# Patient Record
Sex: Female | Born: 1940 | Race: White | Hispanic: No | Marital: Married | Smoking: Never smoker
Health system: Southern US, Community
[De-identification: ages and names within clinical notes are randomized; demographics above are authoritative.]

## PROBLEM LIST (undated history)

## (undated) DIAGNOSIS — M1990 Unspecified osteoarthritis, unspecified site: Secondary | ICD-10-CM

## (undated) DIAGNOSIS — E785 Hyperlipidemia, unspecified: Secondary | ICD-10-CM

## (undated) DIAGNOSIS — I10 Essential (primary) hypertension: Secondary | ICD-10-CM

## (undated) DIAGNOSIS — G500 Trigeminal neuralgia: Secondary | ICD-10-CM

## (undated) DIAGNOSIS — L900 Lichen sclerosus et atrophicus: Secondary | ICD-10-CM

## (undated) DIAGNOSIS — E079 Disorder of thyroid, unspecified: Secondary | ICD-10-CM

## (undated) DIAGNOSIS — B351 Tinea unguium: Secondary | ICD-10-CM

## (undated) HISTORY — DX: Essential (primary) hypertension: I10

## (undated) HISTORY — DX: Trigeminal neuralgia: G50.0

## (undated) HISTORY — DX: Unspecified osteoarthritis, unspecified site: M19.90

## (undated) HISTORY — DX: Hyperlipidemia, unspecified: E78.5

## (undated) HISTORY — DX: Tinea unguium: B35.1

## (undated) HISTORY — DX: Lichen sclerosus et atrophicus: L90.0

## (undated) HISTORY — DX: Disorder of thyroid, unspecified: E07.9

---

## 1983-06-27 HISTORY — PX: DILATION AND CURETTAGE OF UTERUS: SHX78

## 2004-03-29 ENCOUNTER — Ambulatory Visit: Payer: Self-pay | Admitting: Internal Medicine

## 2004-07-05 ENCOUNTER — Ambulatory Visit: Payer: Self-pay | Admitting: Unknown Physician Specialty

## 2005-10-12 ENCOUNTER — Ambulatory Visit: Payer: Self-pay | Admitting: Unknown Physician Specialty

## 2006-06-26 HISTORY — PX: COLONOSCOPY: SHX174

## 2006-11-16 ENCOUNTER — Ambulatory Visit: Payer: Self-pay | Admitting: Gastroenterology

## 2009-04-21 ENCOUNTER — Ambulatory Visit: Payer: Self-pay | Admitting: Unknown Physician Specialty

## 2010-05-17 ENCOUNTER — Ambulatory Visit: Payer: Self-pay | Admitting: Unknown Physician Specialty

## 2011-06-12 ENCOUNTER — Ambulatory Visit: Payer: Self-pay | Admitting: Unknown Physician Specialty

## 2011-10-03 ENCOUNTER — Ambulatory Visit: Payer: Self-pay | Admitting: Internal Medicine

## 2011-10-18 ENCOUNTER — Ambulatory Visit: Payer: Self-pay | Admitting: Surgery

## 2011-10-18 DIAGNOSIS — I10 Essential (primary) hypertension: Secondary | ICD-10-CM

## 2011-10-27 ENCOUNTER — Ambulatory Visit: Payer: Self-pay | Admitting: Surgery

## 2012-02-28 ENCOUNTER — Ambulatory Visit: Payer: Self-pay | Admitting: General Practice

## 2014-10-18 NOTE — Op Note (Signed)
PATIENT NAME:  Pamela Garner, Pamela Garner MR#:  [id number] DATE OF BIRTH:  24-Jan-1941  DATE OF PROCEDURE:  10/27/2011  PREOPERATIVE DIAGNOSIS: Chronic cholecystitis.   POSTOPERATIVE DIAGNOSIS: Acute and chronic cholecystitis with cholelithiasis.   PROCEDURE: Laparoscopic cholecystectomy.   SURGEON: Rodena Goldmann III, MD  ANESTHESIA: General.   DESCRIPTION OF PROCEDURE: With the patient in supine position after induction of appropriate general anesthesia, the patient's abdomen was prepped with Betadine, draped in sterile towels. Patient placed head down, feet up position. Small infraumbilical incision was made in standard fashion and carried down bluntly through subcutaneous tissue. Veress needle was used to cannulate the peritoneal cavity. CO2 was insufflated to appropriate pressure measurements. When approximately 2.5 liters of CO2 were instilled, the Veress needle was withdrawn. An 11 mm [employer] port inserted in the peritoneal cavity. Intraperitoneal position was confirmed and CO2 was reinsufflated. The patient was placed in head up, feet down position, rotated slightly to left side. Subxiphoid transverse incision was made and an 11 mm port inserted under direct vision. Two lateral ports 5 mm in size were inserted under direct vision. The gallbladder was barely visible under the omentum. It appeared to be chronically inflamed, indurated and edematous. About 25 mL of dark bilateral were aspirated without difficulty. The gallbladder was then grasped superiorly and laterally exposing the hepatoduodenal ligament. Cystic artery and cystic duct were identified. The cystic duct clip was traced back to its origin in the gallbladder itself. It lay parallel to what I believe is the common bile duct. Cystic duct was clipped on the gallbladder side and opened. There are multiple stones in the cystic duct. I could not thread a cholangiogram catheter into the cystic duct because of its short length and was very  concerned about the possibility of compromising the common bile duct. After evaluating the anatomy once again the cystic duct was doubly clipped on the common duct side and divided. Cystic artery was visualized, doubly clipped and divided. The gallbladder was then dissected free from its bed and delivered using hook and cautery apparatus. Once the gallbladder was free the camera remained in the umbilical port and the gallbladder was captured in an Endo Catch apparatus and brought out through the subxiphoid port. The area was copiously suctioned and irrigated. The camera was then moved to the upper port to review the midline incision. Did not appear to be any evidence of any bowel or vascular injury. The camera was returned to the umbilical port and the subxiphoid port removed. Figure-of-eight suture of 0 Vicryl was used to close the incision using a needle passer to affect closure. The abdomen was then desufflated. All ports withdrawn without difficulty. Skin incisions were closed with 5-0 nylon. The area was infiltrated with 0.25% Marcaine for postoperative pain control. Sterile dressings were applied. The patient returned to the recovery room having tolerated procedure well. Sponge, instrument, and needle counts were correct x2 in the Operating Room.   ____________________________ Rodena Goldmann III, MD rle:cms D: 10/27/2011 10:07:39 ET T: 10/27/2011 10:44:23 ET JOB#: [id number]  cc: Micheline Maze, MD, <Dictator> Adin Hector, MD Rodena Goldmann MD ELECTRONICALLY SIGNED 11/03/2011 7:25

## 2017-05-07 ENCOUNTER — Other Ambulatory Visit: Payer: Self-pay | Admitting: Internal Medicine

## 2017-05-07 DIAGNOSIS — Z1231 Encounter for screening mammogram for malignant neoplasm of breast: Secondary | ICD-10-CM

## 2017-05-30 ENCOUNTER — Ambulatory Visit
Admission: RE | Admit: 2017-05-30 | Discharge: 2017-05-30 | Disposition: A | Discharge status: Home / Self Care | Payer: Medicare Other | Source: Ambulatory Visit | Attending: Internal Medicine | Admitting: Internal Medicine

## 2017-05-30 ENCOUNTER — Encounter: Payer: Self-pay | Admitting: Radiology

## 2017-05-30 DIAGNOSIS — Z1231 Encounter for screening mammogram for malignant neoplasm of breast: Secondary | ICD-10-CM | POA: Diagnosis present

## 2017-06-07 ENCOUNTER — Other Ambulatory Visit: Payer: Self-pay | Admitting: *Deleted

## 2017-06-07 ENCOUNTER — Inpatient Hospital Stay
Admission: RE | Admit: 2017-06-07 | Discharge: 2017-06-07 | Disposition: A | Discharge status: Home / Self Care | Payer: Self-pay | Source: Ambulatory Visit | Attending: *Deleted | Admitting: *Deleted

## 2017-06-07 DIAGNOSIS — Z9289 Personal history of other medical treatment: Secondary | ICD-10-CM

## 2017-06-13 ENCOUNTER — Ambulatory Visit (INDEPENDENT_AMBULATORY_CARE_PROVIDER_SITE_OTHER): Payer: Medicare Other | Admitting: Certified Nurse Midwife

## 2017-06-13 ENCOUNTER — Encounter: Payer: Self-pay | Admitting: Certified Nurse Midwife

## 2017-06-13 VITALS — BP 140/78 | HR 78 | Ht 65.0 in | Wt 182.0 lb

## 2017-06-13 DIAGNOSIS — M1990 Unspecified osteoarthritis, unspecified site: Secondary | ICD-10-CM | POA: Insufficient documentation

## 2017-06-13 DIAGNOSIS — N904 Leukoplakia of vulva: Secondary | ICD-10-CM | POA: Diagnosis not present

## 2017-06-13 DIAGNOSIS — E079 Disorder of thyroid, unspecified: Secondary | ICD-10-CM | POA: Insufficient documentation

## 2017-06-13 DIAGNOSIS — E785 Hyperlipidemia, unspecified: Secondary | ICD-10-CM | POA: Insufficient documentation

## 2017-06-13 DIAGNOSIS — I10 Essential (primary) hypertension: Secondary | ICD-10-CM | POA: Insufficient documentation

## 2017-06-13 MED ORDER — CLOBETASOL PROPIONATE 0.05 % EX OINT: TOPICAL_OINTMENT | CUTANEOUS | 1 refills | Status: DC

## 2017-06-13 NOTE — Progress Notes (Signed)
  History of Present Illness:  Pamela Garner is a 76 y.o. who was started on clobetasol 0.05% in treatment of lichen sclerosus  approximately 12 months ago. Since that time, she states that her symptoms have resolved. She never came back for a follow up, and so has continued to use the ointment 3x/week, even though her symptoms have resolved. She states she is out of ointment now and needs a refill.  Her exam a year ago was significant for whitening of the anterior labia, the perineum and the perianal area.  PMHx: She  has a past medical history of Degenerative joint disease, Hyperlipidemia, Hypertension, Lichen sclerosus, and Thyroid disease. Also,  has a past surgical history that includes Colonoscopy (2008) and Dilation and curettage of uterus (1985)., family history includes Cancer in her maternal grandmother; Diabetes in her brother; Hypertension in her brother; Lung cancer (age of onset: 57) in her father; Raynaud syndrome in her sister.,  reports that  has never smoked. she has never used smokeless tobacco. She reports that she does not drink alcohol or use drugs.  She has a current medication list which includes the following prescription(s): finacea, lisinopril, naproxen sodium, synthroid, and clobetasol ointment. Also, is allergic to chlorpheniramine-phenylephrine.  ROS  Physical Exam:  BP 140/78   Pulse 78   Ht 5\' 5"  (1.651 m)   Wt 182 lb (82.6 kg)   BMI 30.29 kg/m  Body mass index is 30.29 kg/m. Constitutional: Well nourished, well developed female in no acute distress.   Neuro: Grossly intact Psych:  Normal mood and affect.   Vulva: No whitening of the labia. Slight whitening remaining on the perineum.    Assessment: Medication treatment is going very well for her lichen sclerosus.  Plan: Recommend using the clobetasol 2x/week on the perineum x 2 weeks then stop using the ointment unless the symptoms resume, then can restart at 3x/week to the affected area. RX sent to  pharmacy  She was amenable to this plan and we will see her back PRN.  Dalia Heading, CNM [facility], [facility] 06/13/2017  3:23 PM

## 2018-04-29 ENCOUNTER — Emergency Department: Payer: Medicare Other

## 2018-04-29 ENCOUNTER — Other Ambulatory Visit: Payer: Self-pay

## 2018-04-29 ENCOUNTER — Emergency Department
Admission: EM | Admit: 2018-04-29 | Discharge: 2018-04-29 | Disposition: A | Discharge status: Home / Self Care | Payer: Medicare Other | Attending: Emergency Medicine | Admitting: Emergency Medicine

## 2018-04-29 ENCOUNTER — Encounter: Payer: Self-pay | Admitting: Emergency Medicine

## 2018-04-29 DIAGNOSIS — Y929 Unspecified place or not applicable: Secondary | ICD-10-CM | POA: Insufficient documentation

## 2018-04-29 DIAGNOSIS — S60459A Superficial foreign body of unspecified finger, initial encounter: Secondary | ICD-10-CM

## 2018-04-29 DIAGNOSIS — Y999 Unspecified external cause status: Secondary | ICD-10-CM | POA: Insufficient documentation

## 2018-04-29 DIAGNOSIS — Z79899 Other long term (current) drug therapy: Secondary | ICD-10-CM | POA: Diagnosis not present

## 2018-04-29 DIAGNOSIS — Y939 Activity, unspecified: Secondary | ICD-10-CM | POA: Diagnosis not present

## 2018-04-29 DIAGNOSIS — L089 Local infection of the skin and subcutaneous tissue, unspecified: Secondary | ICD-10-CM

## 2018-04-29 DIAGNOSIS — W273XXA Contact with needle (sewing), initial encounter: Secondary | ICD-10-CM | POA: Insufficient documentation

## 2018-04-29 DIAGNOSIS — S60451A Superficial foreign body of left index finger, initial encounter: Secondary | ICD-10-CM | POA: Insufficient documentation

## 2018-04-29 DIAGNOSIS — I10 Essential (primary) hypertension: Secondary | ICD-10-CM | POA: Diagnosis not present

## 2018-04-29 MED ORDER — SULFAMETHOXAZOLE-TRIMETHOPRIM 800-160 MG PO TABS: 1.0000 | ORAL_TABLET | Freq: Two times a day (BID) | ORAL | 0 refills | Status: DC

## 2018-04-29 MED ORDER — SULFAMETHOXAZOLE-TRIMETHOPRIM 800-160 MG PO TABS
1.0000 | ORAL_TABLET | Freq: Once | ORAL | Status: AC
  Administered 2018-04-29: 1 via ORAL
  Filled 2018-04-29: qty 1

## 2018-04-29 MED ORDER — LIDOCAINE HCL (PF) 1 % IJ SOLN
5.0000 mL | Freq: Once | INTRAMUSCULAR | Status: AC
  Administered 2018-04-29: 5 mL
  Filled 2018-04-29: qty 5

## 2018-04-29 NOTE — ED Provider Notes (Signed)
[facility] Emergency Department Provider Note ____________________________________________  Time seen: 2058  I have reviewed the triage vital signs and the nursing notes.  HISTORY  Chief Complaint  Finger Injury  HPI Pamela Garner is a 77 y.o. female who presents to the ED accompanied by her husband, for evaluation of retained foreign body to the left index finger.  Patient describes she accidentally impaled herself with a sewing needle, and notes it broke off within her index finger. She presents with redness and swelling to the fingertip. She reports a current tetanus status.   Past Medical History:  Diagnosis Date  . Degenerative joint disease   . Hyperlipidemia   . Hypertension   . Lichen sclerosus   . Thyroid disease     Patient Active Problem List   Diagnosis Date Noted  . Lichen sclerosus et atrophicus of the vulva 06/13/2017  . Thyroid disease   . Hypertension   . Hyperlipidemia   . Degenerative joint disease     Past Surgical History:  Procedure Laterality Date  . COLONOSCOPY  2008  . DILATION AND CURETTAGE OF UTERUS  1985   endometrial polyps    Prior to Admission medications   Medication Sig Start Date End Date Taking? Authorizing Provider  clobetasol ointment (TEMOVATE) 0.05 % Apply to affected area at hs twice a week x2 weeks then stop. Can resume using 3 times a week if symptoms return 06/13/17   Dalia Heading, CNM  FINACEA 15 % cream APPLY ON THE SKIN DAILY 05/11/17   [provider]  lisinopril (PRINIVIL,ZESTRIL) 20 MG tablet  05/09/17   [provider]  naproxen sodium (ALEVE) 220 MG tablet Take by mouth.    [provider]  sulfamethoxazole-trimethoprim (BACTRIM DS,SEPTRA DS) 800-160 MG tablet Take 1 tablet by mouth 2 (two) times daily. 04/29/18   Nathanial Arrighi, Dannielle Karvonen, PA-C  SYNTHROID 112 MCG tablet  05/09/17   [provider]    Allergies Chlorpheniramine-phenylephrine  Family  History  Problem Relation Age of Onset  . Lung cancer Father 31  . Raynaud syndrome Sister   . Diabetes Brother   . Hypertension Brother   . Cancer Maternal Grandmother        unknown primary    Social History Social History   Tobacco Use  . Smoking status: Never Smoker  . Smokeless tobacco: Never Used  Substance Use Topics  . Alcohol use: No    Frequency: Never  . Drug use: No    Review of Systems  Constitutional: Negative for fever. Cardiovascular: Negative for chest pain. Respiratory: Negative for shortness of breath. Musculoskeletal: Negative for back pain. Left index finger retained FB Skin: Negative for rash. Neurological: Negative for headaches, focal weakness or numbness. ____________________________________________  PHYSICAL EXAM:  VITAL SIGNS: ED Triage Vitals  Enc Vitals Group     BP 04/29/18 2022 (!) 149/63     Pulse Rate 04/29/18 2022 79     Resp 04/29/18 2022 18     Temp 04/29/18 2022 97.6 F (36.4 C)     Temp Source 04/29/18 2022 Oral     SpO2 04/29/18 2022 97 %     Weight 04/29/18 2022 184 lb (83.5 kg)     Height 04/29/18 2022 5\' 4"  (1.626 m)     Head Circumference --      Peak Flow --      Pain Score 04/29/18 2025 2     Pain Loc --      Pain Edu? --  Excl. in [place]? --     Constitutional: Alert and oriented. Well appearing and in no distress. Head: Normocephalic and atraumatic. Eyes: Conjunctivae are normal. Normal extraocular movements Cardiovascular: Normal rate, regular rhythm. Normal distal pulses. Respiratory: Normal respiratory effort. No wheezes/rales/rhonchi. Musculoskeletal: Left index finger with local edema and streaking in the distal phalanx.  There is a defect to the dorsal nail consistent with a puncture wound.  Nontender with normal range of motion in all extremities.  Neurologic:  Normal gait without ataxia. Normal speech and language. No gross focal neurologic deficits are appreciated. Skin:  Skin is warm, dry and intact.  No rash noted. ____________________________________________   RADIOLOGY  Left Index Finger  IMPRESSION: 1. 8 mm metallic foreign body within the distal second digit that either abuts or penetrates the cortex of the distal phalanx tuft. ____________________________________________  PROCEDURES  Bactrim DS 1 PO  .Foreign Body Removal Date/Time: 04/29/2018 10:36 PM Performed by: Melvenia Needles, PA-C Authorized by: Melvenia Needles, PA-C  Consent: Verbal consent obtained. Written consent not obtained. Consent given by: patient Patient understanding: patient states understanding of the procedure being performed Patient consent: the patient's understanding of the procedure matches consent given Site marked: the operative site was marked Imaging studies: imaging studies available Patient identity confirmed: verbally with patient Body area: skin General location: upper extremity Location details: left index finger Anesthesia method: transthecal block.  Anesthesia: Local Anesthetic: lidocaine 1% without epinephrine Anesthetic total: 4 mL  Sedation: Patient sedated: no  Patient restrained: no Patient cooperative: yes Localization method: visualized Removal mechanism: forceps Dressing: antibiotic ointment Tendon involvement: none Depth: subcutaneous Complexity: complex 1 objects recovered. Objects recovered: sewing needle Post-procedure assessment: foreign body removed Patient tolerance: Patient tolerated the procedure well with no immediate complications  The lateral portion of the finger nail was removed exposing the FB within the nailbed.  ____________________________________________  INITIAL IMPRESSION / ASSESSMENT AND PLAN / ED COURSE  Patient with ED evaluation of a retained foreign body to the left index finger.  Patient's nail was impaled with a sewing needle that was retained within the distal fat pad.  X-ray evaluation confirms retained metallic  foreign body.  Patient consents to and tolerates procedure to remove the lateral portion of the nail exposing the proximal end of the sewing needle, and it is removed without difficulty from the nailbed.  No nailbed repair is performed as a puncture wound is appreciated in the nailbed.  The wound is dressed with petroleum gauze and bandages.  Patient is given wound care instructions and a prescription for Bactrim to take as directed.  She will follow-up with primary provider or return to the ED as needed. ____________________________________________  FINAL CLINICAL IMPRESSION(S) / ED DIAGNOSES  Final diagnoses:  Contact with sewing needle, initial encounter  Foreign body in finger-infected, initial encounter      Melvenia Needles, PA-C 04/29/18 2342    Harvest Dark, MD 04/30/18 2353

## 2018-04-29 NOTE — ED Triage Notes (Signed)
Patient ambulatory to triage with steady gait, without difficulty or distress noted; pt reports sewing machine needle went straight thru left index finger; cont to have pain/redness

## 2018-04-29 NOTE — Discharge Instructions (Addendum)
We has successfully removed a broken needle from the fingertip. The nail had to removed and the fingertip infection will be treated with antibiotics. Take them as directed, and keep the wound clean and dry. Follow-up with your provider as needed.

## 2018-05-15 ENCOUNTER — Other Ambulatory Visit: Payer: Self-pay | Admitting: Internal Medicine

## 2018-05-15 DIAGNOSIS — Z1231 Encounter for screening mammogram for malignant neoplasm of breast: Secondary | ICD-10-CM

## 2019-02-17 ENCOUNTER — Other Ambulatory Visit (HOSPITAL_COMMUNITY): Payer: Self-pay | Admitting: Internal Medicine

## 2019-02-17 ENCOUNTER — Other Ambulatory Visit: Payer: Self-pay | Admitting: Internal Medicine

## 2019-02-17 DIAGNOSIS — G44031 Episodic paroxysmal hemicrania, intractable: Secondary | ICD-10-CM

## 2019-02-26 ENCOUNTER — Other Ambulatory Visit: Payer: Self-pay

## 2019-02-26 ENCOUNTER — Ambulatory Visit
Admission: RE | Admit: 2019-02-26 | Discharge: 2019-02-26 | Disposition: A | Discharge status: Home / Self Care | Payer: Medicare Other | Source: Ambulatory Visit | Attending: Internal Medicine | Admitting: Internal Medicine

## 2019-02-26 DIAGNOSIS — G44031 Episodic paroxysmal hemicrania, intractable: Secondary | ICD-10-CM | POA: Insufficient documentation

## 2019-02-26 LAB — POCT I-STAT CREATININE: Creatinine, Ser: 0.8 mg/dL (ref 0.44–1.00)

## 2019-02-26 MED ORDER — GADOBUTROL 1 MMOL/ML IV SOLN
8.0000 mL | Freq: Once | INTRAVENOUS | Status: AC | PRN
  Administered 2019-02-26: 11:00:00 8 mL via INTRAVENOUS

## 2019-07-17 ENCOUNTER — Ambulatory Visit: Payer: Medicare Other | Attending: Internal Medicine

## 2019-07-17 DIAGNOSIS — Z23 Encounter for immunization: Secondary | ICD-10-CM

## 2019-07-17 NOTE — Progress Notes (Signed)
   Covid-19 Vaccination Clinic  Name:  Pamela Garner    MRN: [id number] DOB: [birth year]  07/17/2019  Pamela Garner was observed post Covid-19 immunization for 15 minutes without incidence. She was provided with Vaccine Information Sheet and instruction to access the V-Safe system.   Pamela Garner was instructed to call 911 with any severe reactions post vaccine: Marland Kitchen Difficulty breathing  . Swelling of your face and throat  . A fast heartbeat  . A bad rash all over your body  . Dizziness and weakness    Immunizations Administered    Name Date Dose VIS Date Route   Pfizer COVID-19 Vaccine 07/17/2019  9:42 AM 0.3 mL 06/06/2019 Intramuscular   Manufacturer: [place]   Lot: [id number]   [place]: [id number]

## 2019-08-07 ENCOUNTER — Ambulatory Visit: Payer: Medicare Other

## 2019-08-08 ENCOUNTER — Ambulatory Visit: Payer: Medicare Other | Attending: Internal Medicine

## 2019-08-08 DIAGNOSIS — Z23 Encounter for immunization: Secondary | ICD-10-CM

## 2019-08-08 NOTE — Progress Notes (Signed)
   Covid-19 Vaccination Clinic  Name:  Jayni Trach    MRN: [id number] DOB: 08-07-1940  08/08/2019  Ms. Spradlin was observed post Covid-19 immunization for 15 minutes without incidence. She was provided with Vaccine Information Sheet and instruction to access the V-Safe system.   Ms. Motola was instructed to call 911 with any severe reactions post vaccine: Marland Kitchen Difficulty breathing  . Swelling of your face and throat  . A fast heartbeat  . A bad rash all over your body  . Dizziness and weakness    Immunizations Administered    Name Date Dose VIS Date Route   Pfizer COVID-19 Vaccine 08/08/2019  5:30 PM 0.3 mL 06/06/2019 Intramuscular   Manufacturer: [place]   Lot: [id number]   [place]: [id number]

## 2019-08-09 ENCOUNTER — Ambulatory Visit: Payer: Medicare Other

## 2019-10-14 ENCOUNTER — Other Ambulatory Visit (HOSPITAL_COMMUNITY)
Admission: RE | Admit: 2019-10-14 | Discharge: 2019-10-14 | Disposition: A | Discharge status: Home / Self Care | Payer: Medicare Other | Source: Ambulatory Visit | Attending: Certified Nurse Midwife | Admitting: Certified Nurse Midwife

## 2019-10-14 ENCOUNTER — Ambulatory Visit (INDEPENDENT_AMBULATORY_CARE_PROVIDER_SITE_OTHER): Payer: Medicare Other | Admitting: Certified Nurse Midwife

## 2019-10-14 ENCOUNTER — Other Ambulatory Visit: Payer: Self-pay

## 2019-10-14 ENCOUNTER — Encounter: Payer: Self-pay | Admitting: Certified Nurse Midwife

## 2019-10-14 VITALS — BP 138/82 | Ht 64.0 in | Wt 191.0 lb

## 2019-10-14 DIAGNOSIS — Z124 Encounter for screening for malignant neoplasm of cervix: Secondary | ICD-10-CM | POA: Insufficient documentation

## 2019-10-14 DIAGNOSIS — K219 Gastro-esophageal reflux disease without esophagitis: Secondary | ICD-10-CM | POA: Insufficient documentation

## 2019-10-14 DIAGNOSIS — Z01419 Encounter for gynecological examination (general) (routine) without abnormal findings: Secondary | ICD-10-CM | POA: Diagnosis not present

## 2019-10-14 DIAGNOSIS — N904 Leukoplakia of vulva: Secondary | ICD-10-CM | POA: Diagnosis not present

## 2019-10-14 DIAGNOSIS — Z1231 Encounter for screening mammogram for malignant neoplasm of breast: Secondary | ICD-10-CM

## 2019-10-14 DIAGNOSIS — I4510 Unspecified right bundle-branch block: Secondary | ICD-10-CM | POA: Insufficient documentation

## 2019-10-14 MED ORDER — CLOBETASOL PROPIONATE 0.05 % EX OINT: TOPICAL_OINTMENT | CUTANEOUS | 1 refills | Status: DC

## 2019-10-14 MED ORDER — CLOTRIMAZOLE 1 % EX CREA: 1.0000 "application " | TOPICAL_CREAM | Freq: Two times a day (BID) | CUTANEOUS | 0 refills | Status: DC

## 2019-10-14 NOTE — Progress Notes (Signed)
Gynecology Annual Exam  PCP: Adin Hector, MD  Chief Complaint:  Chief Complaint  Patient presents with  . Gynecologic Exam    History of Present Illness:Pamela Garner is a 79 year old  Caucasian/White female, [place], who presents for her annual exam.  Her menses are absent and she is postmenopausal and is not on hormone therapy (was on HRT from 1991-2006). 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  She has had no spotting.   The patient's past medical history is remarkable for hypertension, DJD, hypothyroidism, hyperlipidemia, lichen sclerosus et atrophicus, trigeminal neuralgia, and dyspepsia.  Her PCP is Dr Ramonita Lab.  Since her last annual GYN exam dated [id number], her lichen sclerosus flares for time to time and se applies Temovate ointment to her vulva. Is currently symptomatic. Needs a refill. She also complains of irritation under her pannus and under her breasts. Has been applying powder to help keep those areas dry. She is also being treated for nail fungus on her feet. She is sexually active. She does not have vaginal dryness.  Her most recent pap smear was obtained 10/28/2013 and was with negative cells and negative HPV DNA.  Her most recent mammogram obtained on 06/07/2017 was negative.   There is no family history of breast cancer. (There is one note in 1985 that states patient's MGM and 2 maternal aunts had breast cancer-but she denies that now-and Dr Caryl Comes states in a 2011 note that there is no hx of breast cancer in the family)  There is no family history of ovarian cancer.  The patient does do occasional monthly self breast exams.  She had a colonoscopy in 11/16/2006 that was normal . She has aged out of colonoscopies She had a recent DEXA scan obtained in 05/21/2019 that was normal The patient does not smoke.  The patient does not drink alcohol. The patient does not use illegal drugs.  The patient does not exercise, but  she has an active lifestyle.  The patient does get adequate calcium in her diet.  She had a recent cholesterol screen by her PCP in 04/2019 that was borderline elevated    Review of Systems: Review of Systems  Constitutional: Negative for chills, fever and weight loss.  HENT: Negative for congestion, sinus pain and sore throat.   Eyes: Negative for blurred vision and pain.  Respiratory: Negative for hemoptysis, shortness of breath and wheezing.   Cardiovascular: Negative for chest pain, palpitations and leg swelling.  Gastrointestinal: Positive for constipation. Negative for abdominal pain, blood in stool, diarrhea, heartburn, nausea and vomiting.  Genitourinary: Negative for dysuria, frequency, hematuria and urgency.  Musculoskeletal: Positive for joint pain. Negative for back pain and myalgias.  Skin: Positive for rash (under breasts and under pannus). Negative for itching.       Positive for nail fungus on her toes.  Neurological: Negative for dizziness, tingling and headaches.  Endo/Heme/Allergies: Negative for environmental allergies and polydipsia. Bruises/bleeds easily.       Negative for hirsutism   Psychiatric/Behavioral: Negative for depression. The patient is not nervous/anxious and does not have insomnia.     Past Medical History:  Past Medical History:  Diagnosis Date  . Degenerative joint disease   . Dermatophytosis, nail    feet  . Hyperlipidemia   . Hypertension   . Lichen sclerosus   . Thyroid disease   . Trigeminal neuralgia of left side of face  Past Surgical History:  Past Surgical History:  Procedure Laterality Date  . COLONOSCOPY  2008  . DILATION AND CURETTAGE OF UTERUS  1985   endometrial polyps    Family History:  Family History  Problem Relation Age of Onset  . Lung cancer Father 47  . Raynaud syndrome Sister   . Diabetes Brother   . Hypertension Brother   . Cancer Maternal Grandmother        unknown primary    Social History:   Social History   Socioeconomic History  . Marital status: Married    Spouse name: Not on file  . Number of children: 2  . Years of education: Not on file  . Highest education level: Not on file  Occupational History  . Not on file  Tobacco Use  . Smoking status: Never Smoker  . Smokeless tobacco: Never Used  Substance and Sexual Activity  . Alcohol use: No  . Drug use: No  . Sexual activity: Yes    Partners: Male    Birth control/protection: Post-menopausal  Other Topics Concern  . Not on file  Social History Narrative  . Not on file   Social Determinants of Health   Financial Resource Strain:   . Difficulty of Paying Living Expenses:   Food Insecurity:   . Worried About Charity fundraiser in the Last Year:   . Arboriculturist in the Last Year:   Transportation Needs:   . Film/video editor (Medical):   Marland Kitchen Lack of Transportation (Non-Medical):   Physical Activity:   . Days of Exercise per Week:   . Minutes of Exercise per Session:   Stress:   . Feeling of Stress :   Social Connections:   . Frequency of Communication with Friends and Family:   . Frequency of Social Gatherings with Friends and Family:   . Attends Religious Services:   . Active Member of Clubs or Organizations:   . Attends Archivist Meetings:   Marland Kitchen Marital Status:   Intimate Partner Violence:   . Fear of Current or Ex-Partner:   . Emotionally Abused:   Marland Kitchen Physically Abused:   . Sexually Abused:     Allergies:  Allergies  Allergen Reactions  . Chlorpheniramine-Phenylephrine Rash    Medications: Current Outpatient Medications on File Prior to Visit  Medication Sig Dispense Refill  . Cyanocobalamin (B-12 PO) Take by mouth daily.    Marland Kitchen FINACEA 15 % cream APPLY ON THE SKIN DAILY  0  . lisinopril (PRINIVIL,ZESTRIL) 20 MG tablet     . naproxen sodium (ALEVE) 220 MG tablet Take by mouth.    . OXcarbazepine (TRILEPTAL) 150 MG tablet Take by mouth.    . SYNTHROID 112 MCG tablet     .  terbinafine (LAMISIL) 250 MG tablet Take 250 mg by mouth daily.     No current facility-administered medications on file prior to visit.   Physical Exam Vitals: BP 138/82   Ht 5\' 4"  (1.626 m)   Wt 86.6 kg   BMI 32.79 kg/m   General: WF in  NAD HEENT: normocephalic, anicteric Neck: no thyroid enlargement, no palpable nodules, no cervical lymphadenopathy  Pulmonary: No increased work of breathing, CTAB Cardiovascular: RRR, without murmur  Breast: Breast symmetrical, no tenderness, no palpable nodules or masses, no skin or nipple retraction present, no nipple discharge.  No axillary, infraclavicular or supraclavicular lymphadenopathy. THere is powder caked on the skin under both breasts Abdomen: Soft, non-tender, non-distended.  Umbilicus  without lesions.  No hepatomegaly or masses palpable. No evidence of hernia. Under pannus on the right there is a 7cm x 2 cm strip of inflammation.  Genitourinary:  External: Whitening of labial sulcus bilaterally with fissures, whitening of the perineum.  Normal urethral meatus, normal  Bartholin's and Skene's glands.    Vagina: Normal vaginal mucosa, no evidence of prolapse.    Cervix: Grossly normal in appearance, no bleeding, non-tender  Uterus: Anteverted, normal size, shape, and consistency, mobile, and non-tender  Adnexa: No adnexal masses, non-tender  Rectal: deferred  Lymphatic: no evidence of inguinal lymphadenopathy Extremities: trace ankle edema, superficial varicose veins bilateral ankles. Neurologic: Grossly intact Psychiatric: mood appropriate, affect full  KOH of scraping from under pannus: + for hyphae   Assessment: 79 y.o. [id number]  postmenopausal gyn exam Lichen sclerosus flare  Apply Temovate ointment qhs x 2 weeks the decrease to 3x/week and return in 6 weeks for follow uo Fungal skin infection  Lotrimin ointment to under breasts and under pannus 1-2x/day until infection clears  Plan:   1) Breast cancer screening -  recommend monthly self breast exams. Patient would like to continue mammograms. Mammogram was ordered today.  2) Colon cancer screening: Has aged out according to her PCP note.  3) Cervical cancer screening - Pap was done. ASCCP guidelines and rational discussed.  Patient opts for every 3 years screening interval  4) Routine healthcare maintenance including cholesterol and diabetes screening managed by PCP   5) DEXA screen-UTD and normal.  6) RTO in 6 weeks for a follow up on lichen sclerosus.  Dalia Heading, CNM

## 2019-10-15 LAB — CYTOLOGY - PAP
Adequacy: ABSENT
Diagnosis: NEGATIVE

## 2019-10-16 ENCOUNTER — Encounter: Payer: Self-pay | Admitting: Certified Nurse Midwife

## 2019-10-22 ENCOUNTER — Encounter: Payer: Self-pay | Admitting: Certified Nurse Midwife

## 2019-10-31 ENCOUNTER — Ambulatory Visit
Admission: RE | Admit: 2019-10-31 | Discharge: 2019-10-31 | Disposition: A | Discharge status: Home / Self Care | Payer: Medicare Other | Source: Ambulatory Visit | Attending: Certified Nurse Midwife | Admitting: Certified Nurse Midwife

## 2019-10-31 DIAGNOSIS — Z1231 Encounter for screening mammogram for malignant neoplasm of breast: Secondary | ICD-10-CM | POA: Insufficient documentation

## 2019-11-24 NOTE — Progress Notes (Addendum)
  History of Present Illness:  Pamela Garner is a 79 y.o. who was started back on clobetasol ointment for a flare of her lichen sclerosus approximately 6 weeks ago. Since that time, she states that her symptoms are improving. She does not have any current itching. She does report getting irritated by her underwear bunching up on the left labia majora/ inner thigh. She has been applying Lotrimin to her fungal infection under her pannus daily. Has no itching or irritation there.  PMHx: She  has a past medical history of Degenerative joint disease, Dermatophytosis, nail, Hyperlipidemia, Hypertension, Lichen sclerosus, Thyroid disease, and Trigeminal neuralgia of left side of face. Also,  has a past surgical history that includes Colonoscopy (2008) and Dilation and curettage of uterus (1985)., family history includes Alzheimer's disease in her brother; Cancer in her maternal grandmother; Cancer (age of onset: 59) in her son; Diabetes in her brother; Hypertension in her brother; Kidney cancer in her brother; Lung cancer (age of onset: 86) in her father; Raynaud syndrome in her sister.,  reports that she has never smoked. She has never used smokeless tobacco. She reports that she does not drink alcohol or use drugs.  She has a current medication list which includes the following prescription(s): clobetasol ointment, clotrimazole, cyanocobalamin, finacea, lisinopril, naproxen sodium, oxcarbazepine, synthroid, and terbinafine. Also, is allergic to chlorpheniramine-phenylephrine.  ROS  Physical Exam:  Vital signs: BP 125/76   Ht 5\' 1"  (1.549 m)   Wt 190 lb 6.4 oz (86.4 kg)   BMI 35.98 kg/m Constitutional: Well nourished, well developed female in no acute distress.  General: WF in NAD Abdomen: rash under pannus is gone. Vulva: erythematous rash, well demarcated on left groin area between left inner thigh and left labia.  Mild whitening of clitoral hood and perineum. There is a linear brown macular area on  inner right labia Neuro: Grossly intact Psych:  Normal mood and affect.    Assessment:  Resolution of fungal infection on abdomen Improvement of lichen sclerosus Vulvitis left labia/ inner thighs Plan: Continue clobetasol on perineal and clitoral area 3x/week Lotrisone BID for rash on left labia She was amenable to this plan and we will see her back in 6 weeks  Dalia Heading, [facility], [facility] 11/24/2019  7:11 PM

## 2019-11-25 ENCOUNTER — Ambulatory Visit (INDEPENDENT_AMBULATORY_CARE_PROVIDER_SITE_OTHER): Payer: Medicare Other | Admitting: Certified Nurse Midwife

## 2019-11-25 ENCOUNTER — Other Ambulatory Visit: Payer: Self-pay

## 2019-11-25 ENCOUNTER — Encounter: Payer: Self-pay | Admitting: Certified Nurse Midwife

## 2019-11-25 VITALS — BP 125/76 | Ht 61.0 in | Wt 190.4 lb

## 2019-11-25 DIAGNOSIS — N904 Leukoplakia of vulva: Secondary | ICD-10-CM | POA: Diagnosis not present

## 2019-11-25 DIAGNOSIS — B369 Superficial mycosis, unspecified: Secondary | ICD-10-CM | POA: Diagnosis not present

## 2019-11-25 DIAGNOSIS — N762 Acute vulvitis: Secondary | ICD-10-CM

## 2019-11-25 MED ORDER — CLOTRIMAZOLE-BETAMETHASONE 1-0.05 % EX CREA: 1.0000 "application " | TOPICAL_CREAM | Freq: Two times a day (BID) | CUTANEOUS | 0 refills | Status: DC

## 2019-11-26 IMAGING — CR DG FINGER INDEX 2+V*L*
1 series · 3 of 3 positions shown · non-contrast
Comparison: 02/28/2012

CLINICAL DATA: Needle injury pain redness and swelling to the tip
of the index finger

EXAM:
LEFT INDEX FINGER 2+V

[Series 1: dg finger index left · 0.14mm/px · 3 of 3 slices shown]
[im 1/3]
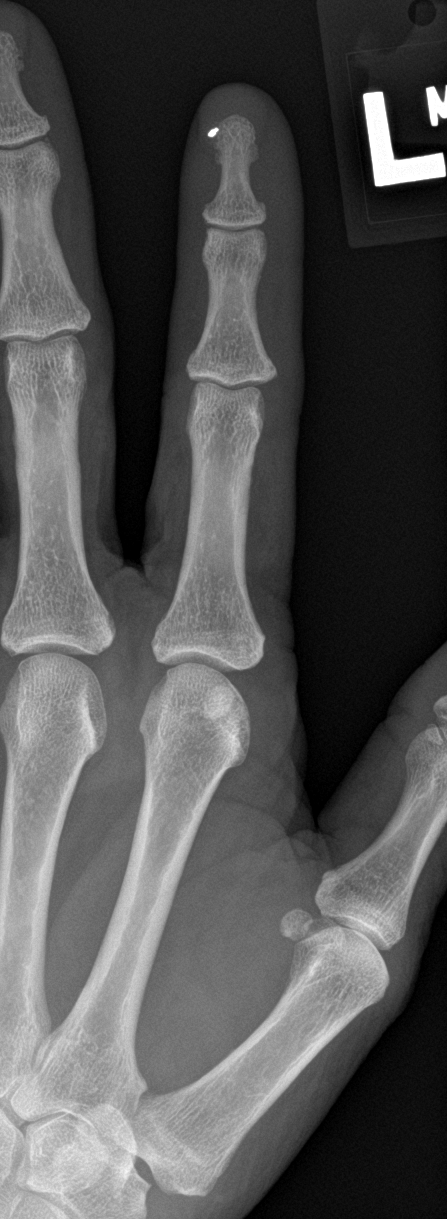
[im 2/3]
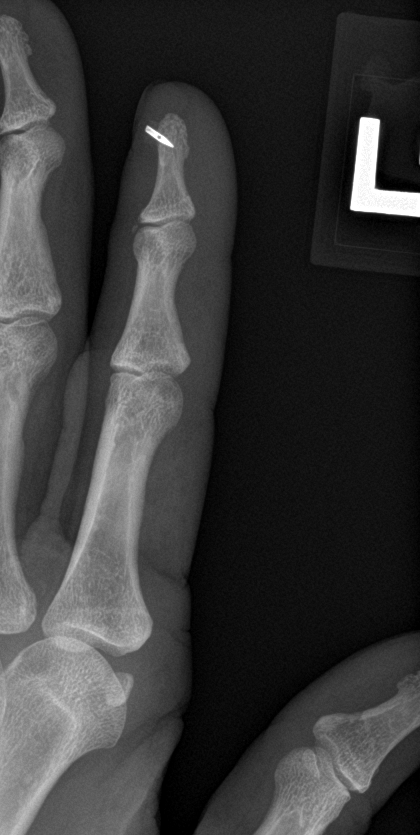
[im 3/3]
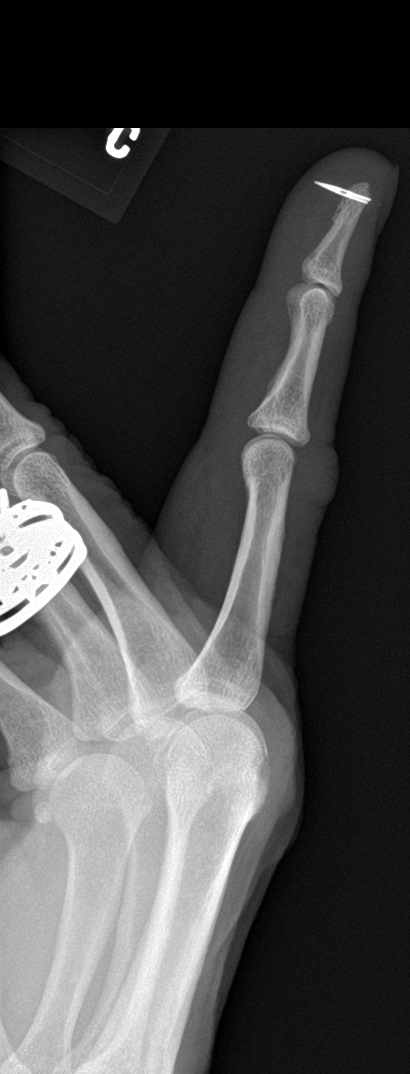

[3 of 3 positions shown; findings below may reference images not displayed]

FINDINGS: No fracture is seen. 8 mm metallic foreign body within the distal
index finger, either abuts or penetrates the cortex of the distal
phalanx on AP view.
IMPRESSION: 1. 8 mm metallic foreign body within the distal second digit that
either abuts or penetrates the cortex of the distal phalanx tuft.

## 2020-01-06 ENCOUNTER — Encounter: Payer: Self-pay | Admitting: Certified Nurse Midwife

## 2020-01-06 ENCOUNTER — Ambulatory Visit (INDEPENDENT_AMBULATORY_CARE_PROVIDER_SITE_OTHER): Payer: Medicare Other | Admitting: Certified Nurse Midwife

## 2020-01-06 ENCOUNTER — Other Ambulatory Visit: Payer: Self-pay

## 2020-01-06 VITALS — BP 134/76 | HR 60 | Ht 64.0 in | Wt 191.0 lb

## 2020-01-06 DIAGNOSIS — N762 Acute vulvitis: Secondary | ICD-10-CM | POA: Diagnosis not present

## 2020-01-06 DIAGNOSIS — N904 Leukoplakia of vulva: Secondary | ICD-10-CM | POA: Diagnosis not present

## 2020-01-06 NOTE — Progress Notes (Signed)
  History of Present Illness:  Pamela Garner is a 79 y.o. who was started back on clobetasol ointment for a flare of her lichen sclerosus approximately 12 weeks ago. Since that time, she reports that  her symptoms have continued to improve.  She does not have any current itching. When she presented 6 weeks ago for a follow up appointment, she was also diagnosed with a fungal infection on the left labia majora/ inner thigh and was prescribed Lotrisone, which she has been applying.  She had already been applying Lotrimin to her fungal infection under her pannus daily. Has no itching or irritation under her pannus.  PMHx: She  has a past medical history of Degenerative joint disease, Dermatophytosis, nail, Hyperlipidemia, Hypertension, Lichen sclerosus, Thyroid disease, and Trigeminal neuralgia of left side of face. Also,  has a past surgical history that includes Colonoscopy (2008) and Dilation and curettage of uterus (1985)., family history includes Alzheimer's disease in her brother; Cancer in her maternal grandmother; Cancer (age of onset: 6) in her son; Diabetes in her brother; Hypertension in her brother; Kidney cancer in her brother; Lung cancer (age of onset: 34) in her father; Raynaud syndrome in her sister.,  reports that she has never smoked. She has never used smokeless tobacco. She reports that she does not drink alcohol and does not use drugs.  She has a current medication list which includes the following prescription(s): clobetasol ointment, clotrimazole, clotrimazole-betamethasone, cyanocobalamin, finacea, lisinopril, naproxen sodium, oxcarbazepine, synthroid, and terbinafine. Also, is allergic to chlorpheniramine-phenylephrine.  ROS  Physical Exam:  Vital signs: BP 134/76   Pulse 60   Ht 5\' 4"  (1.626 m)   Wt 191 lb (86.6 kg)   BMI 32.79 kg/m   Constitutional: Well nourished, well developed female in no acute distress.  General: WF in NAD Abdomen: rash under pannus is gone. Vulva:  Her rash on her left groin area between left inner thigh and left labia has resolved Mild whitening of clitoral hood and perineum. Small fissure on perineum.There is a linear brown macular area on inner right labia Neuro: Grossly intact Psych:  Normal mood and affect.    Assessment:  Resolution of fungal infection on abdomen Improvement of lichen sclerosus Vulvitis left labia/ inner thighs resolved Plan: Continue clobetasol on perineal area qhs x 2 weeks then stop RTO for annual and prn  Dalia Heading, [facility], [facility] 01/06/2020

## 2020-04-12 ENCOUNTER — Ambulatory Visit: Payer: Medicare Other | Attending: Internal Medicine

## 2020-04-12 DIAGNOSIS — Z23 Encounter for immunization: Secondary | ICD-10-CM

## 2020-04-12 NOTE — Progress Notes (Signed)
   Covid-19 Vaccination Clinic  Name:  Pamela Garner    MRN: [id number] DOB: 24-Jan-1941  04/12/2020  Pamela Garner was observed post Covid-19 immunization for 15 minutes without incident. She was provided with Vaccine Information Sheet and instruction to access the V-Safe system.   Pamela Garner was instructed to call 911 with any severe reactions post vaccine: Marland Kitchen Difficulty breathing  . Swelling of face and throat  . A fast heartbeat  . A bad rash all over body  . Dizziness and weakness

## 2020-05-04 ENCOUNTER — Other Ambulatory Visit: Payer: Self-pay | Admitting: Obstetrics and Gynecology

## 2020-05-04 ENCOUNTER — Telehealth: Payer: Self-pay

## 2020-05-04 MED ORDER — CLOTRIMAZOLE-BETAMETHASONE 1-0.05 % EX CREA: 1.0000 "application " | TOPICAL_CREAM | Freq: Two times a day (BID) | CUTANEOUS | 0 refills | Status: DC

## 2020-05-04 NOTE — Telephone Encounter (Signed)
Rx lotrisone crm RF eRxd.

## 2020-05-04 NOTE — Telephone Encounter (Signed)
Pt calling; needs ref of ointment [facility] gave her last - clotrimazole metanidazole dipiro......Marland Kitchen  Doesn't need it all the time just c flare up esp with tight elastic.  [facility] [place].  [phone]

## 2020-05-04 NOTE — Telephone Encounter (Signed)
Generic msg left c female - 'this is her dr's office.  Would you tell her that her rx has been sent in?"

## 2020-05-04 NOTE — Progress Notes (Signed)
Rx RF lotrisone crm prn fungal infection.

## 2020-05-26 ENCOUNTER — Other Ambulatory Visit: Payer: Self-pay | Admitting: Internal Medicine

## 2020-05-26 ENCOUNTER — Other Ambulatory Visit (HOSPITAL_COMMUNITY): Payer: Self-pay | Admitting: Internal Medicine

## 2020-05-26 DIAGNOSIS — R1011 Right upper quadrant pain: Secondary | ICD-10-CM

## 2020-06-14 ENCOUNTER — Ambulatory Visit
Admission: RE | Admit: 2020-06-14 | Discharge: 2020-06-14 | Disposition: A | Discharge status: Home / Self Care | Payer: Medicare Other | Source: Ambulatory Visit | Attending: Internal Medicine | Admitting: Internal Medicine

## 2020-06-14 ENCOUNTER — Other Ambulatory Visit: Payer: Self-pay

## 2020-06-14 DIAGNOSIS — R1011 Right upper quadrant pain: Secondary | ICD-10-CM | POA: Insufficient documentation

## 2020-06-14 MED ORDER — IOHEXOL 300 MG/ML  SOLN
100.0000 mL | Freq: Once | INTRAMUSCULAR | Status: AC | PRN
  Administered 2020-06-14: 09:00:00 100 mL via INTRAVENOUS

## 2020-12-09 ENCOUNTER — Other Ambulatory Visit: Payer: Self-pay | Admitting: Internal Medicine

## 2020-12-09 DIAGNOSIS — Z1231 Encounter for screening mammogram for malignant neoplasm of breast: Secondary | ICD-10-CM

## 2020-12-14 ENCOUNTER — Ambulatory Visit
Admission: RE | Admit: 2020-12-14 | Discharge: 2020-12-14 | Disposition: A | Discharge status: Home / Self Care | Payer: Medicare Other | Source: Ambulatory Visit | Attending: Internal Medicine | Admitting: Internal Medicine

## 2020-12-14 ENCOUNTER — Other Ambulatory Visit: Payer: Self-pay

## 2020-12-14 DIAGNOSIS — Z1231 Encounter for screening mammogram for malignant neoplasm of breast: Secondary | ICD-10-CM | POA: Insufficient documentation

## 2021-09-22 ENCOUNTER — Ambulatory Visit (INDEPENDENT_AMBULATORY_CARE_PROVIDER_SITE_OTHER): Payer: Medicare Other | Admitting: Dermatology

## 2021-09-22 DIAGNOSIS — L578 Other skin changes due to chronic exposure to nonionizing radiation: Secondary | ICD-10-CM

## 2021-09-22 DIAGNOSIS — L719 Rosacea, unspecified: Secondary | ICD-10-CM | POA: Diagnosis not present

## 2021-09-22 DIAGNOSIS — Z1283 Encounter for screening for malignant neoplasm of skin: Secondary | ICD-10-CM

## 2021-09-22 DIAGNOSIS — L814 Other melanin hyperpigmentation: Secondary | ICD-10-CM

## 2021-09-22 DIAGNOSIS — D229 Melanocytic nevi, unspecified: Secondary | ICD-10-CM

## 2021-09-22 DIAGNOSIS — D1800 Hemangioma unspecified site: Secondary | ICD-10-CM

## 2021-09-22 DIAGNOSIS — L821 Other seborrheic keratosis: Secondary | ICD-10-CM

## 2021-09-22 DIAGNOSIS — I8393 Asymptomatic varicose veins of bilateral lower extremities: Secondary | ICD-10-CM

## 2021-09-22 NOTE — Progress Notes (Signed)
? ?New Patient Visit ? ?Subjective  ?Pamela Garner is a 81 y.o. female who presents for the following: New Patient (Initial Visit) (Patient here today for tbse and rosacea. Patient reports she has been using finacea cream to help with rosacea. She denies family or personal history of skin cancer. ). ?The patient presents for Total-Body Skin Exam (TBSE) for skin cancer screening and mole check.  The patient has spots, moles and lesions to be evaluated, some may be new or changing and the patient has concerns that these could be cancer. ? ?The following portions of the chart were reviewed this encounter and updated as appropriate:  ? Tobacco  Allergies  Meds  Problems  Med Hx  Surg Hx  Fam Hx   ?  ?Review of Systems:  No other skin or systemic complaints except as noted in HPI or Assessment and Plan. ? ?Objective  ?Well appearing patient in no apparent distress; mood and affect are within normal limits. ? ?A full examination was performed including scalp, head, eyes, ears, nose, lips, neck, chest, axillae, abdomen, back, buttocks, bilateral upper extremities, bilateral lower extremities, hands, feet, fingers, toes, fingernails, and toenails. All findings within normal limits unless otherwise noted below. ? ?face ?Some pinkness at cheeks and nose  ? ? ?Assessment & Plan  ?Rosacea ?face ?Rosacea is a chronic progressive skin condition usually affecting the face of adults, causing redness and/or acne bumps. It is treatable but not curable. It sometimes affects the eyes (ocular rosacea) as well. It may respond to topical and/or systemic medication and can flare with stress, sun exposure, alcohol, exercise and some foods.  Daily application of broad spectrum spf 30+ sunscreen to face is recommended to reduce flares. ? ?Recommend starting  ?Will prescribe [facility] metronidazole/ivermectin/azelaic acid twice daily as needed to affected areas on the face. The patient was advised this is not covered by  insurance since it is made by a compounding pharmacy. They will receive an email to check out and the medication will be mailed to their home.  ? ?Instructions for [facility] Medications ? ?One or more of your medications was sent to the [facility] mail order compounding pharmacy. You will receive an email from them and can purchase the medicine through that link. It will then be mailed to your home at the address you confirmed. If for any reason you do not receive an email from them, please check your spam folder. If you still do not find the email, please let us know. [facility] phone number is [phone]. ? ?Skin cancer screening ? ?Lentigines ?- Scattered tan macules ?- Due to sun exposure ?- Benign-appearing, observe ?- Recommend daily broad spectrum sunscreen SPF 30+ to sun-exposed areas, reapply every 2 hours as needed. ?- Call for any changes ? ?Seborrheic Keratoses ?- Stuck-on, waxy, tan-brown papules and/or plaques  ?- Benign-appearing ?- Discussed benign etiology and prognosis. ?- Observe ?- Call for any changes ? ?Melanocytic Nevi ?- Tan-brown and/or pink-flesh-colored symmetric macules and papules ?- Benign appearing on exam today ?- Observation ?- Call clinic for new or changing moles ?- Recommend daily use of broad spectrum spf 30+ sunscreen to sun-exposed areas.  ? ?Varicose Veins/Spider Veins ?- Dilated blue, purple or red veins at the lower extremities ?- Reassured ?- Smaller vessels can be treated by sclerotherapy (a procedure to inject a medicine into the veins to make them disappear) if desired, but the treatment is not covered by insurance. Larger vessels may be covered if symptomatic and  we would refer to vascular surgeon if treatment desired. ? ?Hemangiomas ?- Red papules ?- Discussed benign nature ?- Observe ?- Call for any changes ? ?Actinic Damage ?- Chronic condition, secondary to cumulative UV/sun exposure ?- diffuse scaly erythematous macules with underlying  dyspigmentation ?- Recommend daily broad spectrum sunscreen SPF 30+ to sun-exposed areas, reapply every 2 hours as needed.  ?- Staying in the shade or wearing long sleeves, sun glasses (UVA+UVB protection) and wide brim hats (4-inch brim around the entire circumference of the hat) are also recommended for sun protection.  ?- Call for new or changing lesions. ? ?Skin cancer screening performed today. ? ?Return for 1 year rosacea and tbse follow up. ? ?I, Ruthell Rummage, CMA, am acting as scribe for Sarina Ser, MD. ?Documentation: I have reviewed the above documentation for accuracy and completeness, and I agree with the above. ? ?Sarina Ser, MD ? ? ?

## 2021-09-22 NOTE — Patient Instructions (Addendum)
Start [facility] metronidazole/ivermectin/azelaic acid twice daily as needed to affected areas on the face. The patient was advised this is not covered by insurance since it is made by a compounding pharmacy. They will receive an email to check out and the medication will be mailed to their home.  ? ?Instructions for [facility] Medications ? ?One or more of your medications was sent to the [facility] mail order compounding pharmacy. You will receive an email from them and can purchase the medicine through that link. It will then be mailed to your home at the address you confirmed. If for any reason you do not receive an email from them, please check your spam folder. If you still do not find the email, please let us know. [facility] phone number is [phone].  ? ? ? ? ?Melanoma ABCDEs ? ?Melanoma is the most dangerous type of skin cancer, and is the leading cause of death from skin disease.  You are more likely to develop melanoma if you: ?Have light-colored skin, light-colored eyes, or red or blond hair ?Spend a lot of time in the sun ?Tan regularly, either outdoors or in a tanning bed ?Have had blistering sunburns, especially during childhood ?Have a close family member who has had a melanoma ?Have atypical moles or large birthmarks ? ?Early detection of melanoma is key since treatment is typically straightforward and cure rates are extremely high if we catch it early.  ? ?The first sign of melanoma is often a change in a mole or a new dark spot.  The ABCDE system is a way of remembering the signs of melanoma. ? ?A for asymmetry:  The two halves do not match. ?B for border:  The edges of the growth are irregular. ?C for color:  A mixture of colors are present instead of an even brown color. ?D for diameter:  Melanomas are usually (but not always) greater than 83m - the size of a pencil eraser. ?E for evolution:  The spot keeps changing in size, shape, and color. ? ?Please check your skin  once per month between visits. You can use a small mirror in front and a large mirror behind you to keep an eye on the back side or your body.  ? ?If you see any new or changing lesions before your next follow-up, please call to schedule a visit. ? ?Please continue daily skin protection including broad spectrum sunscreen SPF 30+ to sun-exposed areas, reapplying every 2 hours as needed when you're outdoors.  ? ?Staying in the shade or wearing long sleeves, sun glasses (UVA+UVB protection) and wide brim hats (4-inch brim around the entire circumference of the hat) are also recommended for sun protection.   ? ? ?If You Need Anything After Your Visit ? ?If you have any questions or concerns for your doctor, please call our main line at [phone] press option 4 to reach your doctor's medical assistant. If no one answers, please leave a voicemail as directed and we will return your call as soon as possible. Messages left after 4 pm will be answered the following business day.  ? ?You may also send [place] message via MyChart. We typically respond to MyChart messages within 1-2 business days. ? ?For prescription refills, please ask your pharmacy to contact our office. Our fax number is [phone] ? ?If you have an urgent issue when the clinic is closed that cannot wait until the next business day, you can page your doctor at the number  below.   ? ?Please note that while we do our best to be available for urgent issues outside of office hours, we are not available 24/7.  ? ?If you have an urgent issue and are unable to reach [place], you may choose to seek medical care at your doctor's office, retail clinic, urgent care center, or emergency room. ? ?If you have a medical emergency, please immediately call 911 or go to the emergency department. ? ?Pager Numbers ? ?- Dr. Nehemiah Massed: [phone] ? ?- Dr. Laurence Ferrari: [phone] ? ?- Dr. Nicole Kindred: [phone] ? ?In the event of inclement weather, please call our main line at  [phone] for an update on the status of any delays or closures. ? ?Dermatology Medication Tips: ?Please keep the boxes that topical medications come in in order to help keep track of the instructions about where and how to use these. Pharmacies typically print the medication instructions only on the boxes and not directly on the medication tubes.  ? ?If your medication is too expensive, please contact our office at [phone] option 4 or send [place] a message through [place].  ? ?We are unable to tell what your co-pay for medications will be in advance as this is different depending on your insurance coverage. However, we may be able to find a substitute medication at lower cost or fill out paperwork to get insurance to cover a needed medication.  ? ?If a prior authorization is required to get your medication covered by your insurance company, please allow [place] 1-2 business days to complete this process. ? ?Drug prices often vary depending on where the prescription is filled and some pharmacies may offer cheaper prices. ? ?The website www.goodrx.com contains coupons for medications through different pharmacies. The prices here do not account for what the cost may be with help from insurance (it may be cheaper with your insurance), but the website can give you the price if you did not use any insurance.  ?- You can print the associated coupon and take it with your prescription to the pharmacy.  ?- You may also stop by our office during regular business hours and pick up a GoodRx coupon card.  ?- If you need your prescription sent electronically to a different pharmacy, notify our office through [facility] or by phone at [phone] option 4. ? ? ? ? ?Si Usted Necesita Algo Despu?s de Su Visita ? ?Tambi?n puede enviarnos un mensaje a trav?s de MyChart. Por lo general respondemos a los mensajes de MyChart en el transcurso de 1 a 2 d?as h?biles. ? ?Para renovar recetas, por favor pida a su farmacia que se  ponga en contacto con nuestra oficina. Nuestro n?mero de fax es el [phone]. ? ?Si tiene un asunto urgente cuando la cl?nica est? cerrada y que no puede esperar hasta el siguiente d?a h?bil, puede llamar/localizar a su doctor(a) al n?mero que aparece a continuaci?n.  ? ?Por favor, tenga en cuenta que aunque hacemos todo lo posible para estar disponibles para asuntos urgentes fuera del horario de oficina, no estamos disponibles las 24 horas del d?a, los 7 d?as de la semana.  ? ?Si tiene un problema urgente y no puede comunicarse con nosotros, puede optar por buscar atenci?n m?dica  en el consultorio de su doctor(a), en una cl?nica privada, en un centro de atenci?n urgente o en una sala de emergencias. ? ?Si tiene Engineer, maintenance (IT) m?dica, por favor llame inmediatamente al 911 o vaya a la sala de emergencias. ? ?N?meros de b?per ? ?-  Dr. Nehemiah Massed: [phone] ? ?- Dra. Moye: [phone] ? ?- Dra. Nicole Kindred: [phone] ? ?En caso de inclemencias del tiempo, por favor llame a nuestra l?nea principal al [phone] para una actualizaci?n sobre el estado de cualquier retraso o cierre. ? ?Consejos para la medicaci?n en dermatolog?a: ?Por favor, guarde las cajas en las que vienen los medicamentos de uso t?pico para ayudarle a seguir las instrucciones sobre d?nde y c?mo usarlos. Las farmacias generalmente imprimen las instrucciones del medicamento s?lo en las cajas y no directamente en los tubos del [place].  ? ?Si su medicamento es muy caro, por favor, p?ngase en contacto con Zigmund Daniel llamando al [phone] y presione la opci?n 4 o env?enos un mensaje a trav?s de MyChart.  ? ?No podemos decirle cu?l ser? su copago por los medicamentos por adelantado ya que esto es diferente dependiendo de la cobertura de su seguro. Sin embargo, es posible que podamos encontrar un medicamento sustituto a Electrical engineer un formulario para que el seguro cubra el medicamento que se considera necesario.  ? ?Si se requiere  Ardelia Mems autorizaci?n previa para que su compa??a de seguros [place] su medicamento, por favor perm?tanos de 1 a 2 d?as h?biles para completar este proceso. ? ?Los precios de los medicamentos var?an con frecuencia depend

## 2021-09-22 NOTE — Progress Notes (Deleted)
? ?  Follow-Up Visit ?  ?Subjective  ?Pamela Garner is a 81 y.o. female who presents for the following: Follow-up (Patient here today for tbse and rosacea. Finacea cream she is using and needs a refill. ). ? ?*** ? ?The following portions of the chart were reviewed this encounter and updated as appropriate:   ?  ? ?{Review of Systems:34166::"No other skin or systemic complaints."} ? ?Objective  ?Well appearing patient in no apparent distress; mood and affect are within normal limits. ? ?{Exam:34163::"A full examination was performed including scalp, head, eyes, ears, nose, lips, neck, chest, axillae, abdomen, back, buttocks, bilateral upper extremities, bilateral lower extremities, hands, feet, fingers, toes, fingernails, and toenails. All findings within normal limits unless otherwise noted below."} ? ? ?Assessment & Plan  ?Lentigines ?- Scattered tan macules ?- Due to sun exposure ?- Benign-appearing, observe ?- Recommend daily broad spectrum sunscreen SPF 30+ to sun-exposed areas, reapply every 2 hours as needed. ?- Call for any changes ? ?Seborrheic Keratoses ?- Stuck-on, waxy, tan-brown papules and/or plaques  ?- Benign-appearing ?- Discussed benign etiology and prognosis. ?- Observe ?- Call for any changes ? ?Melanocytic Nevi ?- Tan-brown and/or pink-flesh-colored symmetric macules and papules ?- Benign appearing on exam today ?- Observation ?- Call clinic for new or changing moles ?- Recommend daily use of broad spectrum spf 30+ sunscreen to sun-exposed areas.  ? ?Hemangiomas ?- Red papules ?- Discussed benign nature ?- Observe ?- Call for any changes ? ?Actinic Damage ?- Chronic condition, secondary to cumulative UV/sun exposure ?- diffuse scaly erythematous macules with underlying dyspigmentation ?- Recommend daily broad spectrum sunscreen SPF 30+ to sun-exposed areas, reapply every 2 hours as needed.  ?- Staying in the shade or wearing long sleeves, sun glasses (UVA+UVB protection) and wide brim hats  (4-inch brim around the entire circumference of the hat) are also recommended for sun protection.  ?- Call for new or changing lesions. ? ?Skin cancer screening performed today. ?No follow-ups on file. ?I, Ruthell Rummage, CMA, am acting as scribe for Sarina Ser, MD. ? ?

## 2021-09-23 ENCOUNTER — Encounter: Payer: Self-pay | Admitting: Dermatology

## 2021-12-11 ENCOUNTER — Other Ambulatory Visit: Payer: Self-pay

## 2021-12-11 ENCOUNTER — Emergency Department: Payer: Medicare Other

## 2021-12-11 ENCOUNTER — Emergency Department
Admission: EM | Admit: 2021-12-11 | Discharge: 2021-12-11 | Discharge status: Against Medical Advice | Payer: Medicare Other | Attending: Emergency Medicine | Admitting: Emergency Medicine

## 2021-12-11 DIAGNOSIS — M542 Cervicalgia: Secondary | ICD-10-CM | POA: Insufficient documentation

## 2021-12-11 DIAGNOSIS — S0990XA Unspecified injury of head, initial encounter: Secondary | ICD-10-CM | POA: Diagnosis present

## 2021-12-11 DIAGNOSIS — S022XXA Fracture of nasal bones, initial encounter for closed fracture: Secondary | ICD-10-CM | POA: Diagnosis not present

## 2021-12-11 DIAGNOSIS — R600 Localized edema: Secondary | ICD-10-CM | POA: Insufficient documentation

## 2021-12-11 DIAGNOSIS — W010XXA Fall on same level from slipping, tripping and stumbling without subsequent striking against object, initial encounter: Secondary | ICD-10-CM | POA: Diagnosis not present

## 2021-12-11 DIAGNOSIS — M79602 Pain in left arm: Secondary | ICD-10-CM | POA: Diagnosis not present

## 2021-12-11 DIAGNOSIS — Z5321 Procedure and treatment not carried out due to patient leaving prior to being seen by health care provider: Secondary | ICD-10-CM | POA: Insufficient documentation

## 2021-12-11 LAB — BASIC METABOLIC PANEL
Anion gap: 7 (ref 5–15)
BUN: 32 mg/dL — ABNORMAL HIGH (ref 8–23)
CO2: 25 mmol/L (ref 22–32)
Calcium: 9.5 mg/dL (ref 8.9–10.3)
Chloride: 107 mmol/L (ref 98–111)
Creatinine, Ser: 0.87 mg/dL (ref 0.44–1.00)
GFR, Estimated: >60 mL/min (ref >60)
Glucose, Bld: 121 mg/dL — ABNORMAL HIGH (ref 70–99)
Potassium: 4.7 mmol/L (ref 3.5–5.1)
Sodium: 139 mmol/L (ref 135–145)

## 2021-12-11 LAB — CBC
HCT: 46.2 % — ABNORMAL HIGH (ref 36.0–46.0)
Hemoglobin: 14.6 g/dL (ref 12.0–15.0)
MCH: 30.6 pg (ref 26.0–34.0)
MCHC: 31.6 g/dL (ref 30.0–36.0)
MCV: 96.9 fL (ref 80.0–100.0)
Platelets: 200 10*3/uL (ref 150–400)
RBC: 4.77 MIL/uL (ref 3.87–5.11)
RDW: 13.2 % (ref 11.5–15.5)
WBC: 9.8 10*3/uL (ref 4.0–10.5)
nRBC: 0 % (ref 0.0–0.2)

## 2021-12-11 NOTE — ED Notes (Signed)
C-collar removed, ok'd per Dr. Leonides Schanz

## 2021-12-11 NOTE — ED Notes (Signed)
Pt requesting saline lock to be removed due to it being painful.  Explaied that if she needs something that requires an IV, it will need to be put back in.  Pt states "Ill take my chances.  Please remove it".  Catheter removed intact, site clean and dry.  Bleeding controlled.  Dressed with sterile dressing

## 2021-12-11 NOTE — ED Notes (Signed)
Pt's family has decided to leave with pt. Pt states she is going to another hospital.

## 2021-12-11 NOTE — ED Triage Notes (Signed)
Pt arrived via [facility] from home when she tripped and fell on her L side, pt also reports falling onto face, has several front teeth displaced, and c/o swelling to face, nose and mouth.  Pt c/o neck pain with palpation. Placed in [place] on arrival to ED.  Pt also c/o L arm pain,  Not currently on any blood thinners and denies any LOC.

## 2022-01-11 IMAGING — CT CT ABD-PELV W/ CM
2 of 5 series · 16 of 46 positions shown, 18 images · IV contrast (omnipaque)
Comparison: None.

CLINICAL DATA: Worsening right upper quadrant pain for several
weeks

EXAM:
CT ABDOMEN AND PELVIS WITH CONTRAST
TECHNIQUE: Multidetector CT imaging of the abdomen and pelvis was performed
using the standard protocol following bolus administration of
intravenous contrast.
CONTRAST:  100mL OMNIPAQUE IOHEXOL 300 MG/ML  SOLN

[Series 2: abd pelvis 5.00 · axial · 0.70mm/px · z∈[-1510,-1100]mm · 13 of 94 slices shown, 15 images]
[im 6/94  soft-tissue]
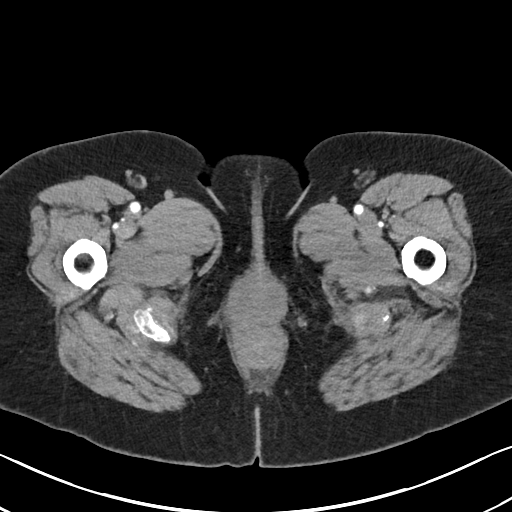
[im 6/94  bone]
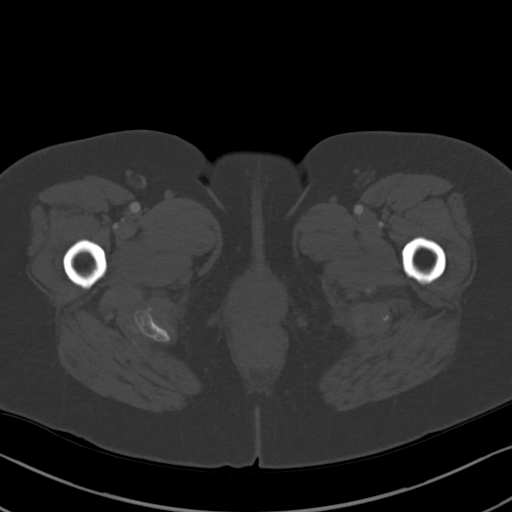
[im 11/94  soft-tissue]
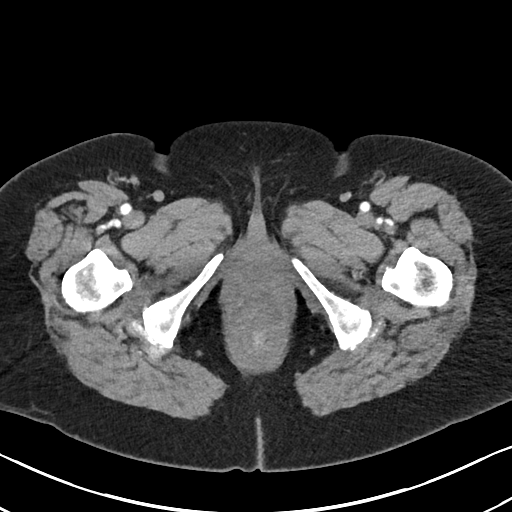
[im 21/94  soft-tissue]
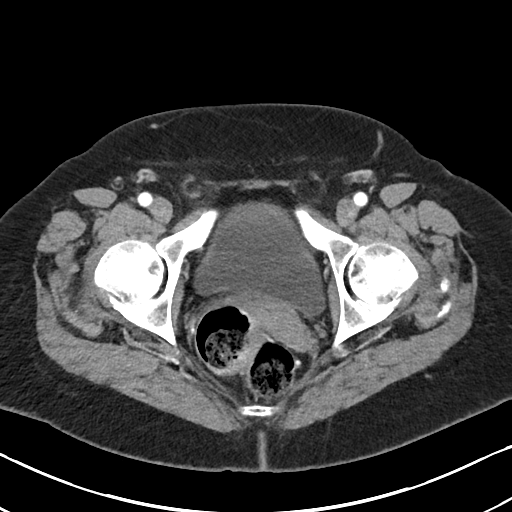
[im 26/94  soft-tissue]
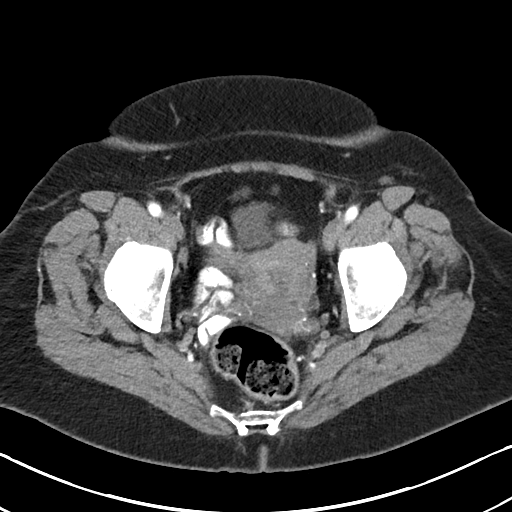
[im 32/94  soft-tissue]
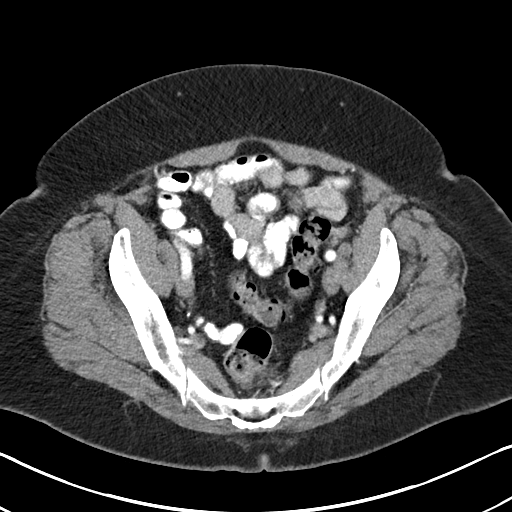
[im 42/94  soft-tissue]
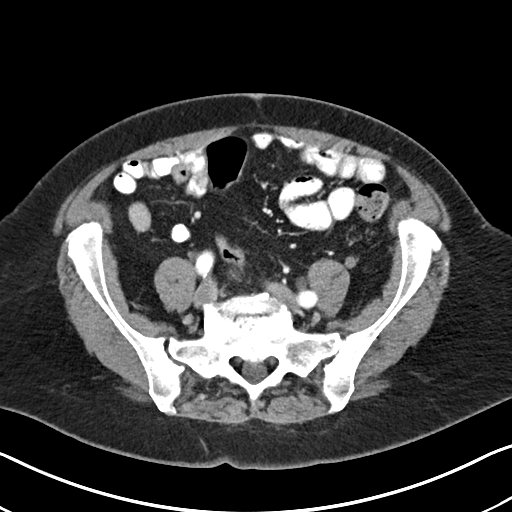
[im 47/94  soft-tissue]
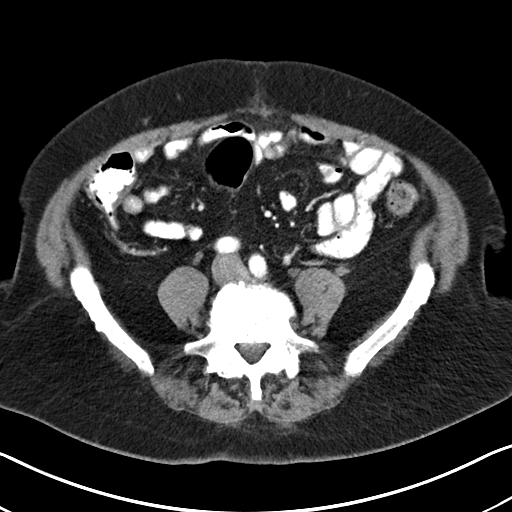
[im 52/94  soft-tissue]
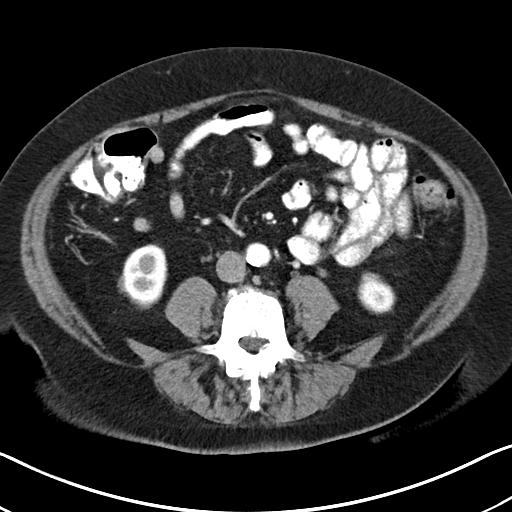
[im 63/94  soft-tissue]
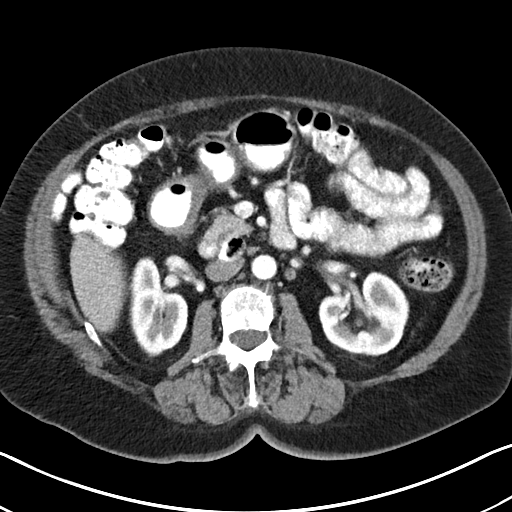
[im 63/94  bone]
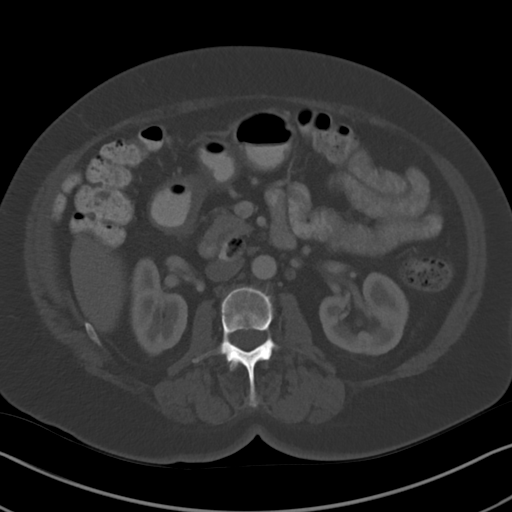
[im 68/94  soft-tissue]
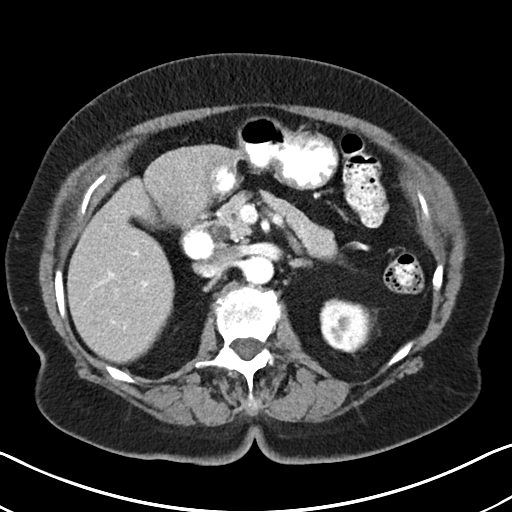
[im 73/94  soft-tissue]
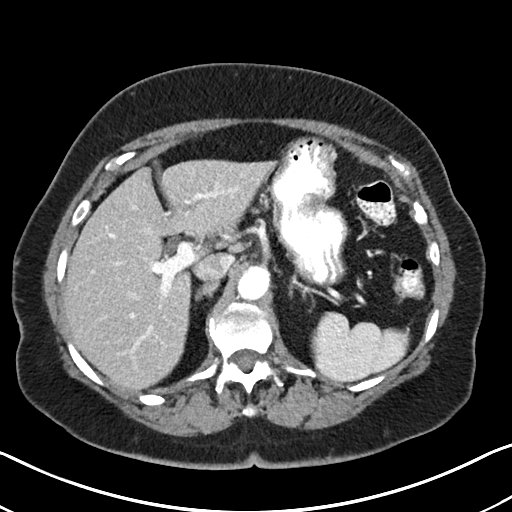
[im 83/94  soft-tissue]
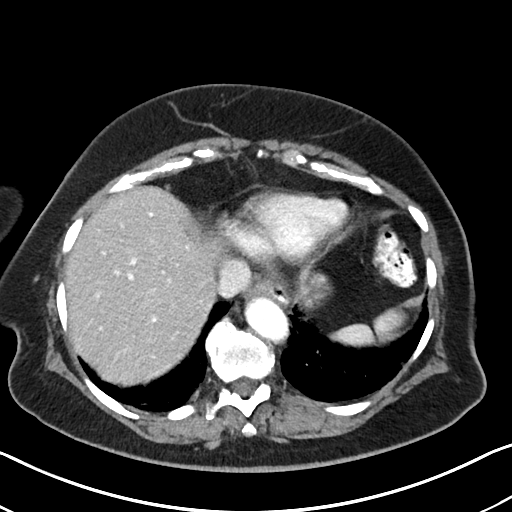
[im 88/94  soft-tissue]
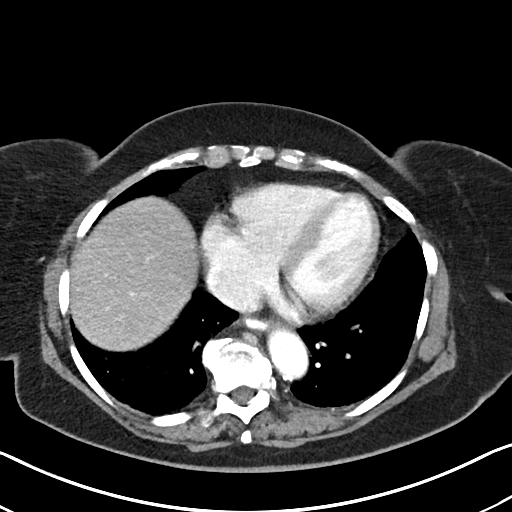

[Series 4: coronals abd pelvis 2.00 cor · coronal · 0.70mm/px · 3 of 152 slices shown]
[im 51/152  soft-tissue]
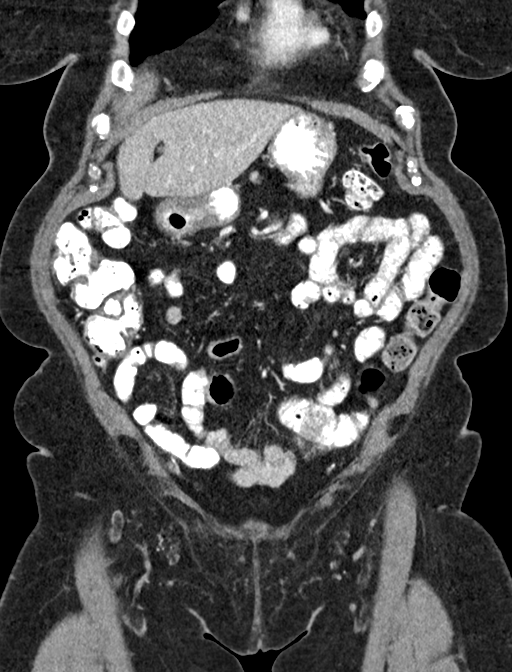
[im 68/152  soft-tissue]
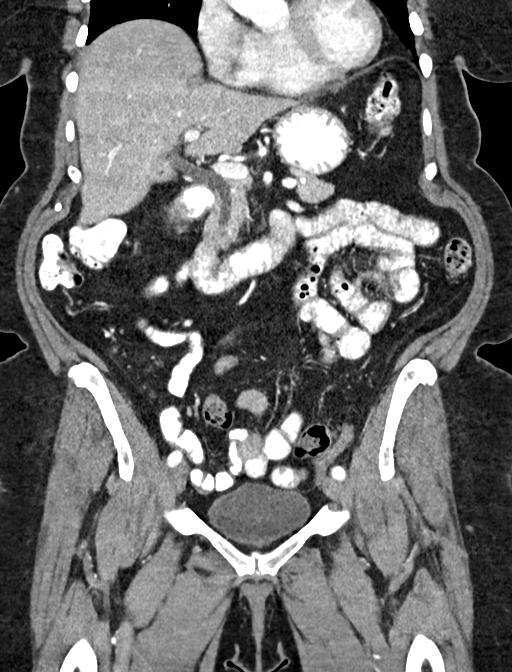
[im 84/152  soft-tissue]
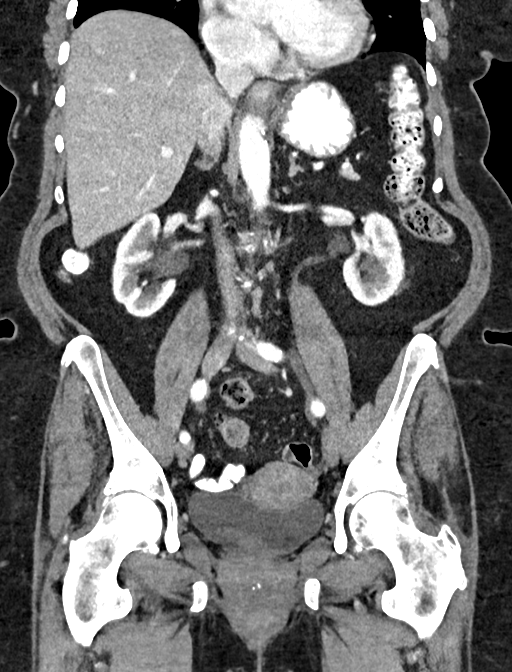

[16 of 46 positions shown; findings below may reference images not displayed]

FINDINGS: Lower chest: No acute abnormality.

Hepatobiliary: Gallbladder has been surgically removed. Diffuse
decreased attenuation is noted within the liver consistent with
fatty infiltration.

Pancreas: Unremarkable. No pancreatic ductal dilatation or
surrounding inflammatory changes.

Spleen: Normal in size without focal abnormality.

Adrenals/Urinary Tract: Adrenal glands are within normal limits.
Kidneys demonstrate a normal enhancement pattern. Normal excretion
of contrast is noted bilaterally. Small parapelvic cysts are noted
bilaterally. No renal calculi or obstructive changes are seen. The
ureters are within normal limits bilaterally. The bladder is
partially distended.

Stomach/Bowel: The appendix is well visualized within normal limits.
No obstructive or inflammatory changes of the colon are seen. Small
bowel and stomach are within normal limits.

Vascular/Lymphatic: Aortic atherosclerosis. No enlarged abdominal or
pelvic lymph nodes.

Reproductive: Uterus and bilateral adnexa are unremarkable.

Other: No abdominal wall hernia or abnormality. No abdominopelvic
ascites.

Musculoskeletal: No acute or significant osseous findings.
IMPRESSION: Fatty liver.

Postsurgical changes.

No acute abnormality noted.

## 2022-10-05 ENCOUNTER — Ambulatory Visit: Payer: Medicare Other | Admitting: Dermatology

## 2023-01-18 ENCOUNTER — Ambulatory Visit: Payer: Medicare Other | Admitting: Dermatology

## 2023-02-01 ENCOUNTER — Ambulatory Visit (INDEPENDENT_AMBULATORY_CARE_PROVIDER_SITE_OTHER): Payer: Medicare Other | Admitting: Dermatology

## 2023-02-01 DIAGNOSIS — L570 Actinic keratosis: Secondary | ICD-10-CM

## 2023-02-01 DIAGNOSIS — Z1283 Encounter for screening for malignant neoplasm of skin: Secondary | ICD-10-CM | POA: Diagnosis not present

## 2023-02-01 DIAGNOSIS — L719 Rosacea, unspecified: Secondary | ICD-10-CM | POA: Diagnosis not present

## 2023-02-01 DIAGNOSIS — I781 Nevus, non-neoplastic: Secondary | ICD-10-CM

## 2023-02-01 DIAGNOSIS — L821 Other seborrheic keratosis: Secondary | ICD-10-CM

## 2023-02-01 DIAGNOSIS — L578 Other skin changes due to chronic exposure to nonionizing radiation: Secondary | ICD-10-CM | POA: Diagnosis not present

## 2023-02-01 DIAGNOSIS — L820 Inflamed seborrheic keratosis: Secondary | ICD-10-CM | POA: Diagnosis not present

## 2023-02-01 DIAGNOSIS — L814 Other melanin hyperpigmentation: Secondary | ICD-10-CM

## 2023-02-01 DIAGNOSIS — Z79899 Other long term (current) drug therapy: Secondary | ICD-10-CM

## 2023-02-01 DIAGNOSIS — W908XXA Exposure to other nonionizing radiation, initial encounter: Secondary | ICD-10-CM

## 2023-02-01 DIAGNOSIS — D229 Melanocytic nevi, unspecified: Secondary | ICD-10-CM

## 2023-02-01 DIAGNOSIS — I8393 Asymptomatic varicose veins of bilateral lower extremities: Secondary | ICD-10-CM

## 2023-02-01 DIAGNOSIS — D1801 Hemangioma of skin and subcutaneous tissue: Secondary | ICD-10-CM

## 2023-02-01 DIAGNOSIS — Z7189 Other specified counseling: Secondary | ICD-10-CM

## 2023-02-01 NOTE — Progress Notes (Signed)
Follow-Up Visit   Subjective  Pamela Garner is a 82 y.o. female who presents for the following: Skin Cancer Screening and Full Body Skin Exam hx of rosacea , reports some spots at temples and face she would like checked.    The patient presents for Total-Body Skin Exam (TBSE) for skin cancer screening and mole check. The patient has spots, moles and lesions to be evaluated, some may be new or changing and the patient may have concern these could be cancer.    The following portions of the chart were reviewed this encounter and updated as appropriate: medications, allergies, medical history  Review of Systems:  No other skin or systemic complaints except as noted in HPI or Assessment and Plan.  Objective  Well appearing patient in no apparent distress; mood and affect are within normal limits.  A full examination was performed including scalp, head, eyes, ears, nose, lips, neck, chest, axillae, abdomen, back, buttocks, bilateral upper extremities, bilateral lower extremities, hands, feet, fingers, toes, fingernails, and toenails. All findings within normal limits unless otherwise noted below.   Relevant physical exam findings are noted in the Assessment and Plan.  left lateral eyebrow x 1 Erythematous thin papules/macules with gritty scale.   left temple x 1, right temple x 1 (2) Erythematous stuck-on, waxy papule or plaque    Assessment & Plan   SKIN CANCER SCREENING PERFORMED TODAY.  ACTINIC DAMAGE - Chronic condition, secondary to cumulative UV/sun exposure - diffuse scaly erythematous macules with underlying dyspigmentation - Recommend daily broad spectrum sunscreen SPF 30+ to sun-exposed areas, reapply every 2 hours as needed.  - Staying in the shade or wearing long sleeves, sun glasses (UVA+UVB protection) and wide brim hats (4-inch brim around the entire circumference of the hat) are also recommended for sun protection.  - Call for new or changing  lesions.  LENTIGINES, SEBORRHEIC KERATOSES, HEMANGIOMAS - Benign normal skin lesions - Benign-appearing - Call for any changes  MELANOCYTIC NEVI - Tan-brown and/or pink-flesh-colored symmetric macules and papules - Benign appearing on exam today - Observation - Call clinic for new or changing moles - Recommend daily use of broad spectrum spf 30+ sunscreen to sun-exposed areas.   Varicose Veins/Spider Veins - Dilated blue, purple or red veins at the lower extremities - Reassured - Smaller vessels can be treated by sclerotherapy (a procedure to inject a medicine into the veins to make them disappear) if desired, but the treatment is not covered by insurance. Larger vessels may be covered if symptomatic and we would refer to vascular surgeon if treatment desired.  Rosacea Face  Exam : Minimal pinkness at cheeks and nose  Chronic and persistent condition with duration or expected duration over one year. Condition is improving with treatment but not currently at goal.   Rosacea is a chronic progressive skin condition usually affecting the face of adults, causing redness and/or acne bumps. It is treatable but not curable. It sometimes affects the eyes (ocular rosacea) as well. It may respond to topical and/or systemic medication and can flare with stress, sun exposure, alcohol, exercise and some foods.  Daily application of broad spectrum spf 30+ sunscreen to face is recommended to reduce flares.   Recommend starting  Continue [facility] metronidazole/ivermectin/azelaic acid twice daily as needed to affected areas on the face. The patient was advised this is not covered by insurance since it is made by a compounding pharmacy. They will receive an email to check out and the medication will be mailed to  their home.    Instructions for [facility] Medications   One or more of your medications was sent to the [facility] mail order compounding pharmacy. You will receive an email  from them and can purchase the medicine through that link. It will then be mailed to your home at the address you confirmed. If for any reason you do not receive an email from them, please check your spam folder. If you still do not find the email, please let us know. [facility] phone number is [phone].    Actinic keratosis left lateral eyebrow x 1  Actinic keratoses are precancerous spots that appear secondary to cumulative UV radiation exposure/sun exposure over time. They are chronic with expected duration over 1 year. A portion of actinic keratoses will progress to squamous cell carcinoma of the skin. It is not possible to reliably predict which spots will progress to skin cancer and so treatment is recommended to prevent development of skin cancer.  Recommend daily broad spectrum sunscreen SPF 30+ to sun-exposed areas, reapply every 2 hours as needed.  Recommend staying in the shade or wearing long sleeves, sun glasses (UVA+UVB protection) and wide brim hats (4-inch brim around the entire circumference of the hat). Call for new or changing lesions.  Destruction of lesion - left lateral eyebrow x 1 Complexity: simple   Destruction method: cryotherapy   Informed consent: discussed and consent obtained   Timeout:  patient name, date of birth, surgical site, and procedure verified Lesion destroyed using liquid nitrogen: Yes   Region frozen until ice ball extended beyond lesion: Yes   Outcome: patient tolerated procedure well with no complications   Post-procedure details: wound care instructions given    Inflamed seborrheic keratosis (2) left temple x 1, right temple x 1  Symptomatic, irritating, patient would like treated.  Destruction of lesion - left temple x 1, right temple x 1 (2) Complexity: simple   Destruction method: cryotherapy   Informed consent: discussed and consent obtained   Timeout:  patient name, date of birth, surgical site, and procedure verified Lesion  destroyed using liquid nitrogen: Yes   Region frozen until ice ball extended beyond lesion: Yes   Outcome: patient tolerated procedure well with no complications   Post-procedure details: wound care instructions given     Return in about 1 year (around 02/01/2024) for TBSE.  IAsher Muir, CMA, am acting as scribe for Armida Sans, MD.   Documentation: I have reviewed the above documentation for accuracy and completeness, and I agree with the above.  Armida Sans, MD

## 2023-02-01 NOTE — Patient Instructions (Addendum)
Instructions for [facility] Medications  One or more of your medications was sent to the [facility] mail order compounding pharmacy. You will receive an email from them and can purchase the medicine through that link. It will then be mailed to your home at the address you confirmed. If for any reason you do not receive an email from them, please check your spam folder. If you still do not find the email, please let us know. [facility] phone number is [phone].     Melanoma ABCDEs  Melanoma is the most dangerous type of skin cancer, and is the leading cause of death from skin disease.  You are more likely to develop melanoma if you: Have light-colored skin, light-colored eyes, or red or blond hair Spend a lot of time in the sun Tan regularly, either outdoors or in a tanning bed Have had blistering sunburns, especially during childhood Have a close family member who has had a melanoma Have atypical moles or large birthmarks  Early detection of melanoma is key since treatment is typically straightforward and cure rates are extremely high if we catch it early.   The first sign of melanoma is often a change in a mole or a new dark spot.  The ABCDE system is a way of remembering the signs of melanoma.  A for asymmetry:  The two halves do not match. B for border:  The edges of the growth are irregular. C for color:  A mixture of colors are present instead of an even brown color. D for diameter:  Melanomas are usually (but not always) greater than 6mm - the size of a pencil eraser. E for evolution:  The spot keeps changing in size, shape, and color.  Please check your skin once per month between visits. You can use a small mirror in front and a large mirror behind you to keep an eye on the back side or your body.   If you see any new or changing lesions before your next follow-up, please call to schedule a visit.  Please continue daily skin protection including broad  spectrum sunscreen SPF 30+ to sun-exposed areas, reapplying every 2 hours as needed when you're outdoors.   Staying in the shade or wearing long sleeves, sun glasses (UVA+UVB protection) and wide brim hats (4-inch brim around the entire circumference of the hat) are also recommended for sun protection.    Due to recent changes in healthcare laws, you may see results of your pathology and/or laboratory studies on MyChart before the doctors have had a chance to review them. We understand that in some cases there may be results that are confusing or concerning to you. Please understand that not all results are received at the same time and often the doctors may need to interpret multiple results in order to provide you with the best plan of care or course of treatment. Therefore, we ask that you please give [place] 2 business days to thoroughly review all your results before contacting the office for clarification. Should we see a critical lab result, you will be contacted sooner.   If You Need Anything After Your Visit  If you have any questions or concerns for your doctor, please call our main line at [phone] and press option 4 to reach your doctor's medical assistant. If no one answers, please leave a voicemail as directed and we will return your call as soon as possible. Messages left after 4 pm will be answered the following business day.  You may also send [place] a message via MyChart. We typically respond to MyChart messages within 1-2 business days.  For prescription refills, please ask your pharmacy to contact our office. Our fax number is [phone].  If you have an urgent issue when the clinic is closed that cannot wait until the next business day, you can page your doctor at the number below.    Please note that while we do our best to be available for urgent issues outside of office hours, we are not available 24/7.   If you have an urgent issue and are unable to reach [place], you may choose  to seek medical care at your doctor's office, retail clinic, urgent care center, or emergency room.  If you have a medical emergency, please immediately call 911 or go to the emergency department.  Pager Numbers  - Dr. Gwen Pounds: [phone]  - Dr. Roseanne Reno: [phone]  In the event of inclement weather, please call our main line at [phone] for an update on the status of any delays or closures.  Dermatology Medication Tips: Please keep the boxes that topical medications come in in order to help keep track of the instructions about where and how to use these. Pharmacies typically print the medication instructions only on the boxes and not directly on the medication tubes.   If your medication is too expensive, please contact our office at [phone] option 4 or send [place] a message through MyChart.   We are unable to tell what your co-pay for medications will be in advance as this is different depending on your insurance coverage. However, we may be able to find a substitute medication at lower cost or fill out paperwork to get insurance to cover a needed medication.   If a prior authorization is required to get your medication covered by your insurance company, please allow [place] 1-2 business days to complete this process.  Drug prices often vary depending on where the prescription is filled and some pharmacies may offer cheaper prices.  The website www.goodrx.com contains coupons for medications through different pharmacies. The prices here do not account for what the cost may be with help from insurance (it may be cheaper with your insurance), but the website can give you the price if you did not use any insurance.  - You can print the associated coupon and take it with your prescription to the pharmacy.  - You may also stop by our office during regular business hours and pick up a GoodRx coupon card.  - If you need your prescription sent electronically to a different pharmacy, notify  our office through [facility] or by phone at [phone] option 4.     Si Usted Necesita Algo Despus de Su Visita  Tambin puede enviarnos un mensaje a travs de Clinical cytogeneticist. Por lo general respondemos a los mensajes de MyChart en el transcurso de 1 a 2 das hbiles.  Para renovar recetas, por favor pida a su farmacia que se ponga en contacto con nuestra oficina. Annie Sable de fax es [place] [phone].  Si tiene un asunto urgente cuando la clnica est cerrada y que no puede esperar hasta el siguiente da hbil, puede llamar/localizar a su doctor(a) al nmero que aparece a continuacin.   Por favor, tenga en cuenta que aunque hacemos todo lo posible para estar disponibles para asuntos urgentes fuera del horario de [place], no estamos disponibles las 24 horas del da, los 7 [address]  [address] de la [place].   Si tiene un problema  urgente y no puede comunicarse con nosotros, puede optar por buscar atencin mdica  en el consultorio de su doctor(a), en una clnica privada, en un centro de atencin urgente o en una sala de emergencias.  Si tiene Engineer, drilling, por favor llame inmediatamente al 911 o vaya a la sala de emergencias.  Nmeros de bper  - Dr. Gwen Pounds: [phone]  - Dra. Roseanne Reno: [phone]  En caso de inclemencias del [place], por favor llame a Lacy Duverney principal al [phone] para una actualizacin sobre el [place] de cualquier retraso o cierre.  Consejos para la medicacin en dermatologa: Por favor, guarde las cajas en las que vienen los medicamentos de uso tpico para ayudarle a seguir las instrucciones sobre dnde y cmo usarlos. Las farmacias generalmente imprimen las instrucciones del medicamento slo en las cajas y no directamente en los tubos del [place].   Si su medicamento es muy caro, por favor, pngase en contacto con Rolm Gala llamando al [phone] y presione la opcin 4 o envenos un mensaje a travs de Clinical cytogeneticist.   No podemos decirle cul  ser su copago por los medicamentos por adelantado ya que esto es diferente dependiendo de la cobertura de su seguro. Sin embargo, es posible que podamos encontrar un medicamento sustituto a Audiological scientist un formulario para que el seguro cubra el medicamento que se considera necesario.   Si se requiere una autorizacin previa para que su compaa de seguros [place] su medicamento, por favor permtanos de 1 a 2 das hbiles para completar [address]  Los precios de los medicamentos varan con frecuencia dependiendo del Environmental consultant de dnde se surte la receta y alguna farmacias pueden ofrecer precios ms baratos.  El sitio web www.goodrx.com tiene cupones para medicamentos de Health and safety inspector. Los precios aqu no tienen en cuenta lo que podra costar con la ayuda del seguro (puede ser ms barato con su seguro), pero el sitio web puede darle el precio si no utiliz Tourist information centre manager.  - Puede imprimir el cupn correspondiente y llevarlo con su receta a la farmacia.  - Tambin puede pasar por nuestra oficina durante el horario de atencin regular y Education officer, museum una tarjeta de cupones de GoodRx.  - Si necesita que su receta se enve electrnicamente a una farmacia diferente, informe a nuestra oficina a travs de MyChart de [place] o por telfono llamando al [phone] y presione la opcin 4.

## 2023-02-11 ENCOUNTER — Encounter: Payer: Self-pay | Admitting: Dermatology

## 2023-06-11 ENCOUNTER — Other Ambulatory Visit: Payer: Self-pay

## 2023-06-11 ENCOUNTER — Emergency Department: Payer: Medicare Other

## 2023-06-11 DIAGNOSIS — E669 Obesity, unspecified: Secondary | ICD-10-CM | POA: Diagnosis present

## 2023-06-11 DIAGNOSIS — E871 Hypo-osmolality and hyponatremia: Secondary | ICD-10-CM | POA: Diagnosis present

## 2023-06-11 DIAGNOSIS — Z8051 Family history of malignant neoplasm of kidney: Secondary | ICD-10-CM

## 2023-06-11 DIAGNOSIS — I480 Paroxysmal atrial fibrillation: Secondary | ICD-10-CM | POA: Diagnosis present

## 2023-06-11 DIAGNOSIS — Z801 Family history of malignant neoplasm of trachea, bronchus and lung: Secondary | ICD-10-CM

## 2023-06-11 DIAGNOSIS — E785 Hyperlipidemia, unspecified: Secondary | ICD-10-CM | POA: Diagnosis present

## 2023-06-11 DIAGNOSIS — R29700 NIHSS score 0: Secondary | ICD-10-CM | POA: Diagnosis present

## 2023-06-11 DIAGNOSIS — I4510 Unspecified right bundle-branch block: Secondary | ICD-10-CM | POA: Diagnosis present

## 2023-06-11 DIAGNOSIS — Z833 Family history of diabetes mellitus: Secondary | ICD-10-CM

## 2023-06-11 DIAGNOSIS — Z79899 Other long term (current) drug therapy: Secondary | ICD-10-CM

## 2023-06-11 DIAGNOSIS — I4892 Unspecified atrial flutter: Secondary | ICD-10-CM | POA: Diagnosis present

## 2023-06-11 DIAGNOSIS — I441 Atrioventricular block, second degree: Secondary | ICD-10-CM | POA: Diagnosis present

## 2023-06-11 DIAGNOSIS — Z806 Family history of leukemia: Secondary | ICD-10-CM

## 2023-06-11 DIAGNOSIS — E079 Disorder of thyroid, unspecified: Secondary | ICD-10-CM | POA: Diagnosis present

## 2023-06-11 DIAGNOSIS — Z7901 Long term (current) use of anticoagulants: Secondary | ICD-10-CM

## 2023-06-11 DIAGNOSIS — H532 Diplopia: Secondary | ICD-10-CM | POA: Diagnosis not present

## 2023-06-11 DIAGNOSIS — I63531 Cerebral infarction due to unspecified occlusion or stenosis of right posterior cerebral artery: Secondary | ICD-10-CM | POA: Diagnosis not present

## 2023-06-11 DIAGNOSIS — Z79890 Hormone replacement therapy (postmenopausal): Secondary | ICD-10-CM

## 2023-06-11 DIAGNOSIS — Z820 Family history of epilepsy and other diseases of the nervous system: Secondary | ICD-10-CM

## 2023-06-11 DIAGNOSIS — Z888 Allergy status to other drugs, medicaments and biological substances status: Secondary | ICD-10-CM

## 2023-06-11 DIAGNOSIS — Z6833 Body mass index (BMI) 33.0-33.9, adult: Secondary | ICD-10-CM

## 2023-06-11 DIAGNOSIS — Z8249 Family history of ischemic heart disease and other diseases of the circulatory system: Secondary | ICD-10-CM

## 2023-06-11 DIAGNOSIS — K219 Gastro-esophageal reflux disease without esophagitis: Secondary | ICD-10-CM | POA: Diagnosis present

## 2023-06-11 DIAGNOSIS — I10 Essential (primary) hypertension: Secondary | ICD-10-CM | POA: Diagnosis present

## 2023-06-11 LAB — BASIC METABOLIC PANEL
Anion gap: 9 (ref 5–15)
BUN: 24 mg/dL — ABNORMAL HIGH (ref 8–23)
CO2: 25 mmol/L (ref 22–32)
Calcium: 9 mg/dL (ref 8.9–10.3)
Chloride: 96 mmol/L — ABNORMAL LOW (ref 98–111)
Creatinine, Ser: 0.9 mg/dL (ref 0.44–1.00)
GFR, Estimated: >60 mL/min (ref >60)
Glucose, Bld: 149 mg/dL — ABNORMAL HIGH (ref 70–99)
Potassium: 3.9 mmol/L (ref 3.5–5.1)
Sodium: 130 mmol/L — ABNORMAL LOW (ref 135–145)

## 2023-06-11 LAB — CBC WITH DIFFERENTIAL/PLATELET
Abs Immature Granulocytes: 0.01 10*3/uL (ref 0.00–0.07)
Basophils Absolute: 0.1 10*3/uL (ref 0.0–0.1)
Basophils Relative: 1 %
Eosinophils Absolute: 0.1 10*3/uL (ref 0.0–0.5)
Eosinophils Relative: 1 %
HCT: 44.6 % (ref 36.0–46.0)
Hemoglobin: 14.7 g/dL (ref 12.0–15.0)
Immature Granulocytes: 0 %
Lymphocytes Relative: 18 %
Lymphs Abs: 1.7 10*3/uL (ref 0.7–4.0)
MCH: 31.1 pg (ref 26.0–34.0)
MCHC: 33 g/dL (ref 30.0–36.0)
MCV: 94.3 fL (ref 80.0–100.0)
Monocytes Absolute: 1.1 10*3/uL — ABNORMAL HIGH (ref 0.1–1.0)
Monocytes Relative: 12 %
Neutro Abs: 6.3 10*3/uL (ref 1.7–7.7)
Neutrophils Relative %: 68 %
Platelets: 253 10*3/uL (ref 150–400)
RBC: 4.73 MIL/uL (ref 3.87–5.11)
RDW: 12.6 % (ref 11.5–15.5)
WBC: 9.3 10*3/uL (ref 4.0–10.5)
nRBC: 0 % (ref 0.0–0.2)

## 2023-06-11 NOTE — ED Provider Triage Note (Signed)
Emergency Medicine Provider Triage Evaluation Note  Pamela Garner , a 82 y.o. female  was evaluated in triage.  Pt complains of double vision that began on Saturday. Patient tried to "rest it off" but went to the [place] clinic this morning because it wasn't getting any better.   Review of Systems  Positive: Blurry vision, headache Negative:   Physical Exam  There were no vitals taken for this visit. Gen:   Awake, no distress   Resp:  Normal effort  MSK:   Moves extremities without difficulty  Other:    Medical Decision Making  Medically screening exam initiated at 2:06 PM.  Appropriate orders placed.  Pamela Garner was informed that the remainder of the evaluation will be completed by another provider, this initial triage assessment does not replace that evaluation, and the importance of remaining in the ED until their evaluation is complete.     Cameron Ali, PA-C 06/11/23 1409

## 2023-06-11 NOTE — ED Triage Notes (Signed)
Pt sent from [facility] for double vision which started yesterday. Had headaches last couple days but nothing now.

## 2023-06-12 ENCOUNTER — Inpatient Hospital Stay
Admission: EM | Admit: 2023-06-12 | Discharge: 2023-06-13 | DRG: 065 | Disposition: A | Discharge status: Home / Self Care | Payer: Medicare Other | Attending: Student | Admitting: Student

## 2023-06-12 ENCOUNTER — Observation Stay: Payer: Medicare Other

## 2023-06-12 ENCOUNTER — Encounter: Payer: Self-pay | Admitting: Radiology

## 2023-06-12 ENCOUNTER — Emergency Department: Payer: Medicare Other

## 2023-06-12 ENCOUNTER — Telehealth (HOSPITAL_COMMUNITY): Payer: Self-pay | Admitting: Pharmacy Technician

## 2023-06-12 ENCOUNTER — Other Ambulatory Visit (HOSPITAL_COMMUNITY): Payer: Self-pay

## 2023-06-12 DIAGNOSIS — I4892 Unspecified atrial flutter: Secondary | ICD-10-CM

## 2023-06-12 DIAGNOSIS — Z79890 Hormone replacement therapy (postmenopausal): Secondary | ICD-10-CM | POA: Diagnosis not present

## 2023-06-12 DIAGNOSIS — H532 Diplopia: Secondary | ICD-10-CM | POA: Diagnosis present

## 2023-06-12 DIAGNOSIS — Z801 Family history of malignant neoplasm of trachea, bronchus and lung: Secondary | ICD-10-CM | POA: Diagnosis not present

## 2023-06-12 DIAGNOSIS — I6389 Other cerebral infarction: Secondary | ICD-10-CM | POA: Diagnosis not present

## 2023-06-12 DIAGNOSIS — Z8051 Family history of malignant neoplasm of kidney: Secondary | ICD-10-CM | POA: Diagnosis not present

## 2023-06-12 DIAGNOSIS — E079 Disorder of thyroid, unspecified: Secondary | ICD-10-CM | POA: Diagnosis present

## 2023-06-12 DIAGNOSIS — Z6833 Body mass index (BMI) 33.0-33.9, adult: Secondary | ICD-10-CM | POA: Diagnosis not present

## 2023-06-12 DIAGNOSIS — Z8249 Family history of ischemic heart disease and other diseases of the circulatory system: Secondary | ICD-10-CM | POA: Diagnosis not present

## 2023-06-12 DIAGNOSIS — R29700 NIHSS score 0: Secondary | ICD-10-CM | POA: Diagnosis present

## 2023-06-12 DIAGNOSIS — K219 Gastro-esophageal reflux disease without esophagitis: Secondary | ICD-10-CM | POA: Diagnosis present

## 2023-06-12 DIAGNOSIS — E669 Obesity, unspecified: Secondary | ICD-10-CM | POA: Diagnosis present

## 2023-06-12 DIAGNOSIS — I4891 Unspecified atrial fibrillation: Secondary | ICD-10-CM | POA: Insufficient documentation

## 2023-06-12 DIAGNOSIS — Z820 Family history of epilepsy and other diseases of the nervous system: Secondary | ICD-10-CM | POA: Diagnosis not present

## 2023-06-12 DIAGNOSIS — I639 Cerebral infarction, unspecified: Secondary | ICD-10-CM | POA: Insufficient documentation

## 2023-06-12 DIAGNOSIS — Z7901 Long term (current) use of anticoagulants: Secondary | ICD-10-CM | POA: Diagnosis not present

## 2023-06-12 DIAGNOSIS — E785 Hyperlipidemia, unspecified: Secondary | ICD-10-CM | POA: Diagnosis present

## 2023-06-12 DIAGNOSIS — I480 Paroxysmal atrial fibrillation: Secondary | ICD-10-CM | POA: Diagnosis present

## 2023-06-12 DIAGNOSIS — Z888 Allergy status to other drugs, medicaments and biological substances status: Secondary | ICD-10-CM | POA: Diagnosis not present

## 2023-06-12 DIAGNOSIS — I63531 Cerebral infarction due to unspecified occlusion or stenosis of right posterior cerebral artery: Secondary | ICD-10-CM | POA: Diagnosis present

## 2023-06-12 DIAGNOSIS — Z833 Family history of diabetes mellitus: Secondary | ICD-10-CM | POA: Diagnosis not present

## 2023-06-12 DIAGNOSIS — I4510 Unspecified right bundle-branch block: Secondary | ICD-10-CM | POA: Diagnosis present

## 2023-06-12 DIAGNOSIS — I63411 Cerebral infarction due to embolism of right middle cerebral artery: Secondary | ICD-10-CM

## 2023-06-12 DIAGNOSIS — I441 Atrioventricular block, second degree: Secondary | ICD-10-CM | POA: Diagnosis present

## 2023-06-12 DIAGNOSIS — I10 Essential (primary) hypertension: Secondary | ICD-10-CM | POA: Diagnosis present

## 2023-06-12 DIAGNOSIS — I4821 Permanent atrial fibrillation: Secondary | ICD-10-CM

## 2023-06-12 DIAGNOSIS — Z79899 Other long term (current) drug therapy: Secondary | ICD-10-CM | POA: Diagnosis not present

## 2023-06-12 DIAGNOSIS — Z806 Family history of leukemia: Secondary | ICD-10-CM | POA: Diagnosis not present

## 2023-06-12 DIAGNOSIS — E871 Hypo-osmolality and hyponatremia: Secondary | ICD-10-CM | POA: Diagnosis present

## 2023-06-12 LAB — HEMOGLOBIN A1C
Hgb A1c MFr Bld: 5.7 % — ABNORMAL HIGH (ref 4.8–5.6)
Mean Plasma Glucose: 116.89 mg/dL

## 2023-06-12 LAB — TROPONIN I (HIGH SENSITIVITY): Troponin I (High Sensitivity): 7 ng/L (ref <18)

## 2023-06-12 LAB — TSH: TSH: 1.442 u[IU]/mL (ref 0.350–4.500)

## 2023-06-12 LAB — MAGNESIUM: Magnesium: 1.4 mg/dL — ABNORMAL LOW (ref 1.7–2.4)

## 2023-06-12 MED ORDER — ATORVASTATIN CALCIUM 20 MG PO TABS: 80.0000 mg | ORAL_TABLET | Freq: Every day | ORAL | Status: DC

## 2023-06-12 MED ORDER — ENOXAPARIN SODIUM 40 MG/0.4ML IJ SOSY
40.0000 mg | PREFILLED_SYRINGE | INTRAMUSCULAR | Status: DC
  Administered 2023-06-12: 40 mg via SUBCUTANEOUS
  Filled 2023-06-12: qty 0.4

## 2023-06-12 MED ORDER — PANTOPRAZOLE SODIUM 40 MG PO TBEC
40.0000 mg | DELAYED_RELEASE_TABLET | Freq: Every day | ORAL | Status: DC
  Administered 2023-06-12 – 2023-06-13 (×2): 40 mg via ORAL
  Filled 2023-06-12 (×2): qty 1

## 2023-06-12 MED ORDER — IOHEXOL 350 MG/ML SOLN
75.0000 mL | Freq: Once | INTRAVENOUS | Status: AC | PRN
  Administered 2023-06-12: 75 mL via INTRAVENOUS

## 2023-06-12 MED ORDER — ASPIRIN 81 MG PO CHEW
324.0000 mg | CHEWABLE_TABLET | Freq: Once | ORAL | Status: AC
  Administered 2023-06-12: 324 mg via ORAL
  Filled 2023-06-12: qty 4

## 2023-06-12 MED ORDER — ACETAMINOPHEN 325 MG RE SUPP
650.0000 mg | RECTAL | Status: DC | PRN
Start: 2023-06-12 — End: 2023-06-13

## 2023-06-12 MED ORDER — ENOXAPARIN SODIUM 40 MG/0.4ML IJ SOSY: 40.0000 mg | PREFILLED_SYRINGE | INTRAMUSCULAR | Status: DC

## 2023-06-12 MED ORDER — APIXABAN 5 MG PO TABS
5.0000 mg | ORAL_TABLET | Freq: Two times a day (BID) | ORAL | Status: DC
Start: 2023-06-12 — End: 2023-06-13
  Administered 2023-06-12 – 2023-06-13 (×3): 5 mg via ORAL
  Filled 2023-06-12 (×4): qty 1

## 2023-06-12 MED ORDER — LEVOTHYROXINE SODIUM 112 MCG PO TABS
112.0000 ug | ORAL_TABLET | Freq: Every day | ORAL | Status: DC
  Administered 2023-06-12 – 2023-06-13 (×2): 112 ug via ORAL
  Filled 2023-06-12 (×2): qty 1

## 2023-06-12 MED ORDER — STROKE: EARLY STAGES OF RECOVERY BOOK: Freq: Once | Status: DC

## 2023-06-12 MED ORDER — ACETAMINOPHEN 160 MG/5ML PO SOLN: 650.0000 mg | ORAL | Status: DC | PRN

## 2023-06-12 MED ORDER — ACETAMINOPHEN 325 MG PO TABS
650.0000 mg | ORAL_TABLET | ORAL | Status: DC | PRN
  Administered 2023-06-12 – 2023-06-13 (×2): 650 mg via ORAL
  Filled 2023-06-12 (×2): qty 2

## 2023-06-12 MED ORDER — ATORVASTATIN CALCIUM 20 MG PO TABS
80.0000 mg | ORAL_TABLET | Freq: Every day | ORAL | Status: DC
  Administered 2023-06-12: 80 mg via ORAL
  Filled 2023-06-12: qty 4

## 2023-06-12 MED ORDER — SODIUM CHLORIDE 0.9 % IV SOLN
INTRAVENOUS | Status: DC
Start: 2023-06-12 — End: 2023-06-12

## 2023-06-12 MED ORDER — SENNOSIDES-DOCUSATE SODIUM 8.6-50 MG PO TABS: 1.0000 | ORAL_TABLET | Freq: Every evening | ORAL | Status: DC | PRN

## 2023-06-12 MED ORDER — SODIUM CHLORIDE 0.9 % IV BOLUS
500.0000 mL | Freq: Once | INTRAVENOUS | Status: AC
  Administered 2023-06-12: 500 mL via INTRAVENOUS

## 2023-06-12 NOTE — Assessment & Plan Note (Signed)
 Cont synthroid

## 2023-06-12 NOTE — Consult Note (Signed)
Cardiology Consultation   Patient ID: Pamela Garner MRN: [id number]; DOB: 1940-07-29  Admit date: 06/12/2023 Date of Consult: 06/12/2023  PCP:  Lynnea Ferrier, MD   [facility] Providers Cardiologist:  None        Patient Profile:   Pamela Garner is a 82 y.o. female with a hx of obesity, hypertension, hyperlipidemia, thyroid disease, and new CVA who is being seen 06/12/2023 for the evaluation of new onset atrial fib/flutter at the request of Dr. Alvester Morin.  History of Present Illness:   Ms. Poythress denies any cardiac history and has never seen a cardiologist. Did report that several years ago she was having "skipped beats" due to over-caffeination per her PCP. No recurrence since reducing coffee consumption to 1 cup per day. No pertinent family history. Uses alcohol on special occasions. Denies tobacco and illicit drug use.  Patient presented to the ED 12/16 with headache, dizziness, and diplopia ongoing since 12/13. Patient reports being mildly confused although she did drive herself to an event Friday morning. Patient took it easy over the weekend and when symptoms persisted she decided to be seen. Denies chest pain, palpitations, shortness of breath, nausea, and vomiting.   In the ED, HR 74, BP 157/75, SpO2 97%. Labs pertinent for WBC 9.3, hgb 14.7, platelets 253, Cr 0.9, and glucose 149. Troponin negative x2. Initial CT head concerning for possible hyperdense right MCA. CTA of the head neck with severe stenosis or occlusion of P1 segment of the left PCA. MRI of the brain with moderate-sized acute infarct in the right occipital parietal cortex.    Past Medical History:  Diagnosis Date   Degenerative joint disease    Dermatophytosis, nail    feet   Hyperlipidemia    Hypertension    Lichen sclerosus    Thyroid disease    Trigeminal neuralgia of left side of face     Past Surgical History:  Procedure Laterality Date   COLONOSCOPY  2008   DILATION AND  CURETTAGE OF UTERUS  1985   endometrial polyps     Home Medications:  Prior to Admission medications   Medication Sig Start Date End Date Taking? Authorizing Provider  clobetasol ointment (TEMOVATE) 0.05 % Apply to affected area every night for 2 weeks then apply 3 x/week 10/14/19  Yes Farrel Conners, CNM  Cyanocobalamin (B-12 PO) Take by mouth daily.   Yes [provider]  FINACEA 15 % cream APPLY ON THE SKIN DAILY 05/11/17  Yes [provider]  ibuprofen (ADVIL) 800 MG tablet Take 800 mg by mouth every 8 (eight) hours as needed. 12/11/21  Yes [provider]  lisinopril (PRINIVIL,ZESTRIL) 20 MG tablet  05/09/17  Yes [provider]  naproxen sodium (ALEVE) 220 MG tablet Take by mouth.   Yes [provider]  OXcarbazepine (TRILEPTAL) 150 MG tablet Take 150 mg by mouth 2 (two) times daily.   Yes [provider]  SYNTHROID 112 MCG tablet  05/09/17  Yes [provider]    Inpatient Medications: Scheduled Meds:  [START ON 06/13/2023]  stroke: early stages of recovery book   Does not apply Once   [START ON 06/13/2023] atorvastatin  80 mg Oral QHS   enoxaparin (LOVENOX) injection  40 mg Subcutaneous Q24H   levothyroxine  112 mcg Oral Q0600   pantoprazole  40 mg Oral Daily   Continuous Infusions:  sodium chloride     PRN Meds: acetaminophen **OR** acetaminophen (TYLENOL) oral liquid 160 mg/5 mL **OR** acetaminophen,  senna-docusate  Allergies:    Allergies  Allergen Reactions   Chlorpheniramine-Phenylephrine Rash    Social History:   Social History   Socioeconomic History   Marital status: Married    Spouse name: Not on file   Number of children: 2   Years of education: Not on file   Highest education level: Not on file  Occupational History   Not on file  Tobacco Use   Smoking status: Never   Smokeless tobacco: Never  Vaping Use   Vaping status: Never Used  Substance and Sexual Activity   Alcohol use: No    Drug use: No   Sexual activity: Yes    Partners: Male    Birth control/protection: Post-menopausal  Other Topics Concern   Not on file  Social History Narrative   Not on file   Social Drivers of Health   Financial Resource Strain: Not on file  Food Insecurity: Not on file  Transportation Needs: Not on file  Physical Activity: Not on file  Stress: Not on file  Social Connections: Not on file  Intimate Partner Violence: Not on file    Family History:    Family History  Problem Relation Age of Onset   Lung cancer Father 5   Raynaud syndrome Sister    Cancer Maternal Grandmother        unknown primary?stomach   Diabetes Brother    Hypertension Brother    Alzheimer's disease Brother    Kidney cancer Brother    Cancer Son 18       ?leukemia (neck)   Breast cancer Neg Hx      ROS:  Please see the history of present illness.    Physical Exam/Data:   Vitals:   06/12/23 0946 06/12/23 0957 06/12/23 1000 06/12/23 1100  BP:   (!) 159/79 (!) 162/77  Pulse: 92  78 79  Resp: 16  13 16   Temp:  97.7 F (36.5 C)    TempSrc:  Oral    SpO2: 99%  98% 98%    Intake/Output Summary (Last 24 hours) at 06/12/2023 1316 Last data filed at 06/12/2023 0406 Gross per 24 hour  Intake 500 ml  Output --  Net 500 ml      01/06/2020    3:05 PM 11/25/2019   10:15 AM 10/14/2019   10:24 AM  Last 3 Weights  Weight (lbs) 191 lb 190 lb 6.4 oz 191 lb  Weight (kg) 86.637 kg 86.365 kg 86.637 kg     There is no height or weight on file to calculate BMI.  General:  Well nourished, well developed, in no acute distress HEENT: normal Neck: no JVD Cardiac:  normal S1, S2; RRR; no murmur  Lungs:  clear to auscultation bilaterally, no wheezing, rhonchi or rales  Abd: soft, nontender, no hepatomegaly  Ext: no edema Skin: warm and dry  Psych:  Normal affect   EKG:  The EKG was personally reviewed and demonstrates:  atrial flutter rate 72 with RBBB Telemetry:  Telemetry was personally  reviewed and demonstrates:  atrial fib/flutter rate 70s with conversion to sinus rhythm at 0730 rate 70-90s with first degree AV block and second degree AV block, Wenckebach  Relevant CV Studies: Echo ordered  Laboratory Data:  High Sensitivity Troponin:   Recent Labs  Lab 06/11/23 1409  TROPONINIHS 7     Chemistry Recent Labs  Lab 06/11/23 1409  NA 130*  K 3.9  CL 96*  CO2 25  GLUCOSE 149*  BUN 24*  CREATININE 0.90  CALCIUM 9.0  GFRNONAA >60  ANIONGAP 9    No results for input(s): "PROT", "ALBUMIN", "AST", "ALT", "ALKPHOS", "BILITOT" in the last 168 hours. Lipids No results for input(s): "CHOL", "TRIG", "HDL", "LABVLDL", "LDLCALC", "CHOLHDL" in the last 168 hours.  Hematology Recent Labs  Lab 06/11/23 1409  WBC 9.3  RBC 4.73  HGB 14.7  HCT 44.6  MCV 94.3  MCH 31.1  MCHC 33.0  RDW 12.6  PLT 253   Thyroid No results for input(s): "TSH", "FREET4" in the last 168 hours.  BNPNo results for input(s): "BNP", "PROBNP" in the last 168 hours.  DDimer No results for input(s): "DDIMER" in the last 168 hours.   Radiology/Studies:  MR BRAIN WO CONTRAST Result Date: 06/12/2023 CLINICAL DATA:  Stroke follow-up for double vision. EXAM: MRI HEAD WITHOUT CONTRAST TECHNIQUE: Multiplanar, multiecho pulse sequences of the brain and surrounding structures were obtained without intravenous contrast. COMPARISON:  None Available. FINDINGS: Brain: Restricted diffusion involving the right occipital parietal cortex. A moderate area is affected. Chronic small vessel ischemia in the cerebral white matter and pons which is mild for age. Small chronic left cerebellar infarct. No hemorrhage, hydrocephalus, mass, or collection. Vascular: Major flow voids are preserved Skull and upper cervical spine: Degenerative facet spurring with low T1 signal at the right C3-4 facet, usually degenerative marrow edema. Sinuses/Orbits: Negative IMPRESSION: Moderate size acute infarct in the right occipital  parietal cortex. Electronically Signed   By: Tiburcio Pea M.D.   On: 06/12/2023 04:04   CT ANGIO HEAD NECK W WO CM Result Date: 06/12/2023 CLINICAL DATA:  Diplopia EXAM: CT ANGIOGRAPHY HEAD AND NECK WITH AND WITHOUT CONTRAST TECHNIQUE: Multidetector CT imaging of the head and neck was performed using the standard protocol during bolus administration of intravenous contrast. Multiplanar CT image reconstructions and MIPs were obtained to evaluate the vascular anatomy. Carotid stenosis measurements (when applicable) are obtained utilizing NASCET criteria, using the distal internal carotid diameter as the denominator. RADIATION DOSE REDUCTION: This exam was performed according to the departmental dose-optimization program which includes automated exposure control, adjustment of the mA and/or kV according to patient size and/or use of iterative reconstruction technique. CONTRAST:  75mL OMNIPAQUE IOHEXOL 350 MG/ML SOLN COMPARISON:  None Available. FINDINGS: CTA NECK FINDINGS Skeleton: No acute abnormality or high grade bony spinal canal stenosis. Other neck: Normal pharynx, larynx and major salivary glands. No cervical lymphadenopathy. Unremarkable thyroid gland. Upper chest: No pneumothorax or pleural effusion. No nodules or masses. Aortic arch: There is no calcific atherosclerosis of the aortic arch. Conventional 3 vessel aortic branching pattern. RIGHT carotid system: Normal without aneurysm, dissection or stenosis. LEFT carotid system: Normal without aneurysm, dissection or stenosis. Vertebral arteries: Left dominant configuration. There is no dissection, occlusion or flow-limiting stenosis to the skull base (V1-V3 segments). CTA HEAD FINDINGS POSTERIOR CIRCULATION: Mild atherosclerotic calcification of both V4 segments without hemodynamically significant stenosis. No proximal occlusion of the anterior or inferior cerebellar arteries. Basilar artery is normal. Superior cerebellar arteries are normal. The right  PCA is normal. There is a partial fetal origin of the left PCA. The normal P1 segment is severely stenotic or occluded at the left P1 2 junction. There is also severe stenosis of the distal left P-comm. Distal left PCA is normal. ANTERIOR CIRCULATION: Atherosclerotic calcification of the internal carotid arteries at the skull base without hemodynamically significant stenosis. Anterior cerebral arteries are normal. Middle cerebral arteries are normal. Venous sinuses: As permitted by contrast timing, patent. Anatomic variants: Partial fetal origin  of the left posterior cerebral artery. Review of the MIP images confirms the above findings. IMPRESSION: 1. 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 2. No hemodynamically significant stenosis of the neck. Electronically Signed   By: Deatra Robinson M.D.   On: 06/12/2023 02:12   CT Head Wo Contrast Result Date: 06/11/2023 CLINICAL DATA:  Headache, neuro deficit EXAM: CT HEAD WITHOUT CONTRAST TECHNIQUE: Contiguous axial images were obtained from the base of the skull through the vertex without intravenous contrast. RADIATION DOSE REDUCTION: This exam was performed according to the departmental dose-optimization program which includes automated exposure control, adjustment of the mA and/or kV according to patient size and/or use of iterative reconstruction technique. COMPARISON:  Head CT 12/11/21 FINDINGS: Brain: No hemorrhage. No hydrocephalus. No extra-axial fluid collection. No CT evidence of an acute cortical infarct mass effect. No mass lesion. There is a background of mild chronic microvascular ischemic change. Vascular: Possible hyperdense right MCA Skull: Normal. Negative for fracture or focal lesion. Sinuses/Orbits: No middle ear or mastoid effusion. Paranasal sinuses are clear. Orbits are unremarkable. Other: None. IMPRESSION: 1.  No hemorrhage or CT  evidence of an acute cortical infarct. 2. Possible hyperdense right MCA. Recommend CTA head/neck for further evaluation. Electronically Signed   By: Lorenza Cambridge M.D.   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

## 2023-06-12 NOTE — Evaluation (Signed)
Occupational Therapy Evaluation Patient Details Name: Elizebeth Rushmore MRN: [id number] DOB: 06/11/1941 Today's Date: 06/12/2023   History of Present Illness Pt is an 50 year of female, presenting with new onset atrial fibrillation complicated by right PCA stroke which has affected her peripheral left inferior quadrant vision. PMH significant for Degenerative joint disease, Dermatophytosis, nail, Hyperlipidemia, Hypertension, Lichen sclerosus, Thyroid disease, and Trigeminal neuralgia of left side of face.  Presenting with   Clinical Impression   Chart reviewed, pt greeted on edge of stretcher bed, requesting to use bathroom. Pt is alert and oriented x4, good awareness of deficits, higher level executive functioning will continue to be assessed> PTA pt is MOD I-I in ADL/IADL amb home/community distances Prn with SPC. Pt presents with deficits in activity tolerance, balance, , ?cognition affecting safe and optimal ADL completion. Pt has documented vision deficit, no noted functional impacts on evaluation, however will continue to assess. Pt will benefit from ongoing OT to address deficits and to facilitate optimal ADL performance.       If plan is discharge home, recommend the following: Assist for transportation;Direct supervision/assist for medications management;Assistance with cooking/housework    Functional Status Assessment  Patient has had a recent decline in their functional status and demonstrates the ability to make significant improvements in function in a reasonable and predictable amount of time.  Equipment Recommendations  Tub/shower bench    Recommendations for Other Services       Precautions / Restrictions Precautions Precautions: Fall Precaution Comments: watch HR Restrictions Weight Bearing Restrictions Per Provider Order: No      Mobility Bed Mobility Overal bed mobility: Modified Independent                  Transfers Overall transfer level: Needs  assistance   Transfers: Sit to/from Stand Sit to Stand: Supervision                  Balance Overall balance assessment: Needs assistance Sitting-balance support: Feet supported Sitting balance-Leahy Scale: Normal     Standing balance support: Single extremity supported, During functional activity Standing balance-Leahy Scale: Good                             ADL either performed or assessed with clinical judgement   ADL Overall ADL's : Needs assistance/impaired Eating/Feeding: Set up   Grooming: Wash/dry hands;Supervision/safety;Standing Grooming Details (indicate cue type and reason): sink level             Lower Body Dressing: Maximal assistance Lower Body Dressing Details (indicate cue type and reason): baseline requires shoe horn for shoes Toilet Transfer: Minimal assistance Toilet Transfer Details (indicate cue type and reason): via hand held assist Toileting- Clothing Manipulation and Hygiene: Supervision/safety;Sitting/lateral lean Toileting - Clothing Manipulation Details (indicate cue type and reason): continent BM on toilet     Functional mobility during ADLs: Contact guard assist;Minimal assistance (approx 100', hand held assist)       Vision Patient Visual Report:  (reports diplopia has resolved) Vision Assessment?: Yes Eye Alignment: Within Functional Limits Ocular Range of Motion: Within Functional Limits Tracking/Visual Pursuits: Able to track stimulus in all quads without difficulty Saccades: Within functional limits Convergence: Within functional limits Visual Fields: Right visual field deficit (per neuro note slight loss of right inferior peripheral visual field- does not appear to impact pt functionally however will continue to assess) Additional Comments: pt drew clock with correct time approrpiately     Perception  Perception: Within Functional Limits       Praxis Praxis: WFL       Pertinent Vitals/Pain Pain  Assessment Pain Assessment: No/denies pain     Extremity/Trunk Assessment Upper Extremity Assessment Upper Extremity Assessment: Overall WFL for tasks assessed   Lower Extremity Assessment Lower Extremity Assessment: Defer to PT evaluation       Communication Communication Communication: No apparent difficulties Cueing Techniques: Verbal cues;Tactile cues   Cognition Arousal: Alert Behavior During Therapy: WFL for tasks assessed/performed Overall Cognitive Status: Impaired/Different from baseline Area of Impairment: Problem solving                             Problem Solving: Requires verbal cues General Comments: Pt with good safety awareness, ? mild higher level executive functioning deficits however will continue to assess;     General Comments  HR up to 170 bpm after transfer to toilet, quickly back down to 110-120s bpm for duration of mobility, nurse notified    Exercises Other Exercises Other Exercises: edu re: role of OT, role of rehab   Shoulder Instructions      Home Living Family/patient expects to be discharged to:: Private residence Living Arrangements: Spouse/significant other Available Help at Discharge: Family Type of Home: House Home Access: Stairs to enter Secretary/administrator of Steps: 3 Entrance Stairs-Rails: Right Home Layout: One level     Bathroom Shower/Tub: Chief Strategy Officer: Handicapped height     Home Equipment: Cane - single point;Grab bars - tub/shower          Prior Functioning/Environment Prior Level of Function : Independent/Modified Independent             Mobility Comments: PRN use of SPC ADLs Comments: MOD I-I in ADL/IADL, drives/cooks/cleans        OT Problem List: Decreased activity tolerance;Decreased knowledge of use of DME or AE;Decreased cognition      OT Treatment/Interventions: Self-care/ADL training;Therapeutic exercise;Patient/family education;Balance training;Therapeutic  activities;DME and/or AE instruction    OT Goals(Current goals can be found in the care plan section) Acute Rehab OT Goals Patient Stated Goal: go home OT Goal Formulation: With patient Time For Goal Achievement: 06/26/23 Potential to Achieve Goals: Good ADL Goals Pt Will Perform Grooming: Independently Pt Will Perform Lower Body Dressing: with modified independence;sitting/lateral leans Pt Will Transfer to Toilet: ambulating;with modified independence Pt Will Perform Toileting - Clothing Manipulation and hygiene: with modified independence;sitting/lateral leans Additional ADL Goal #1: pt will participate in further cogntive assessment to faciltiate optimal performance of the IADL of medication management  OT Frequency: Min 1X/week    Co-evaluation              AM-PAC OT "6 Clicks" Daily Activity     Outcome Measure Help from another person eating meals?: None Help from another person taking care of personal grooming?: None Help from another person toileting, which includes using toliet, bedpan, or urinal?: None Help from another person bathing (including washing, rinsing, drying)?: A Little Help from another person to put on and taking off regular upper body clothing?: None Help from another person to put on and taking off regular lower body clothing?: A Lot 6 Click Score: 21   End of Session Equipment Utilized During Treatment: Gait belt Nurse Communication: Mobility status (vitals)  Activity Tolerance: Patient tolerated treatment well Patient left: in bed;with call bell/phone within reach  OT Visit Diagnosis: Unsteadiness on feet (R26.81)  Time: 1610-9604 OT Time Calculation (min): 17 min Charges:  OT General Charges $OT Visit: 1 Visit OT Evaluation $OT Eval Low Complexity: 1 Low  Oleta Mouse, OTD OTR/L  06/12/23, 2:08 PM

## 2023-06-12 NOTE — Assessment & Plan Note (Signed)
PPI ?

## 2023-06-12 NOTE — ED Notes (Signed)
Juice provided. Patient up ad lib. Will continue to monitor.

## 2023-06-12 NOTE — Progress Notes (Signed)
Physical Therapy Evaluation Patient Details Name: Pamela Garner MRN: [id number] DOB: August 24, 1940 Today's Date: 06/12/2023  History of Present Illness  Pamela Garner is a 82 y.o. female with medical history significant of obesity, hypertension, hyperlipidemia, thyroid disease presenting with CVA and new onset atrial fibrillation.  Patient reports diplopia formation roughly 4 to 5 days ago.  Had woke up in the morning with significant frontal headache as well as double vision. CTA of the head neck with severe stenosis or occlusion of P1 segment of the left PCA.  MRI of the brain with moderate-sized acute infarct in the right occipital parietal cortex.   Clinical Impression  Orders Received. Chart Reviewed. Patient supine in stretcher upon therapist arrival, with family members at bedside. Prior to admission, patient was IND with mobility and ADLs, endorses PRN use of SPC. On evaluation, patient Mod I with bed mobility, require supervision for transfers and ability to ambulate x 100 ft with no AD. Supervision for safety. HR monitored, with 130-140's with mobility, returned to baseline with return to bed. Patient reports improvements in vision deficits noted in chart. Patient will benefit from skilled acute PT services to address functional impairments (see below for additional) and maximize functional mobility. Do not anticipate the need for follow up PT services upon acute hospital discharge. Will continue to follow acutely .          If plan is discharge home, recommend the following: Assistance with cooking/housework;Assist for transportation   Can travel by private vehicle        Equipment Recommendations None recommended by PT  Recommendations for Other Services       Functional Status Assessment Patient has had a recent decline in their functional status and demonstrates the ability to make significant improvements in function in a reasonable and predictable amount of time.      Precautions / Restrictions Precautions Precautions: Fall Precaution Comments: watch HR Restrictions Weight Bearing Restrictions Per Provider Order: No      Mobility  Bed Mobility Overal bed mobility: Modified Independent                  Transfers Overall transfer level: Needs assistance   Transfers: Sit to/from Stand Sit to Stand: Supervision           General transfer comment: able to stand from EOB, supervisoin fro safety. No imbalance noted.    Ambulation/Gait Ambulation/Gait assistance: Supervision, Contact guard assist Gait Distance (Feet): 100 Feet Assistive device: None Gait Pattern/deviations: WFL(Within Functional Limits)       General Gait Details: able to ambulate ~ 100 ft w/o AD use, mild unsteadiness, initially improving with prolonged distance.  Stairs            Wheelchair Mobility     Tilt Bed    Modified Rankin (Stroke Patients Only)       Balance Overall balance assessment: Needs assistance Sitting-balance support: Feet supported Sitting balance-Leahy Scale: Normal     Standing balance support: No upper extremity supported, During functional activity Standing balance-Leahy Scale: Good Standing balance comment: no overt LOB with ambulation/mobility and no use of AD                             Pertinent Vitals/Pain Pain Assessment Pain Assessment: No/denies pain    Home Living Family/patient expects to be discharged to:: Private residence Living Arrangements: Spouse/significant other Available Help at Discharge: Family Type of Home: House Home Access: Stairs to  enter Entrance Stairs-Rails: Right Entrance Stairs-Number of Steps: 3   Home Layout: One level Home Equipment: Cane - single point;Grab bars - tub/shower      Prior Function Prior Level of Function : Independent/Modified Independent             Mobility Comments: reports IND, intermittent use of SPC ADLs Comments: MOD I-I in ADL/IADL,  drives/cooks/cleans     Extremity/Trunk Assessment   Upper Extremity Assessment Upper Extremity Assessment: Overall WFL for tasks assessed    Lower Extremity Assessment Lower Extremity Assessment: Overall WFL for tasks assessed       Communication   Communication Communication: No apparent difficulties Cueing Techniques: Verbal cues;Tactile cues  Cognition Arousal: Alert Behavior During Therapy: WFL for tasks assessed/performed Overall Cognitive Status: Impaired/Different from baseline                                          General Comments General comments (skin integrity, edema, etc.): HR in 130 - 140's with activity this date. Denies    Exercises     Assessment/Plan    PT Assessment Patient needs continued PT services  PT Problem List Decreased balance;Decreased mobility;Decreased activity tolerance       PT Treatment Interventions Gait training;Stair training;Functional mobility training;Therapeutic activities;Therapeutic exercise;Neuromuscular re-education;Balance training    PT Goals (Current goals can be found in the Care Plan section)  Acute Rehab PT Goals Patient Stated Goal: Get Home PT Goal Formulation: With patient Time For Goal Achievement: 06/26/23 Potential to Achieve Goals: Good    Frequency Min 1X/week     Co-evaluation               AM-PAC PT "6 Clicks" Mobility  Outcome Measure Help needed turning from your back to your side while in a flat bed without using bedrails?: None Help needed moving from lying on your back to sitting on the side of a flat bed without using bedrails?: None Help needed moving to and from a bed to a chair (including a wheelchair)?: None Help needed standing up from a chair using your arms (e.g., wheelchair or bedside chair)?: None Help needed to walk in hospital room?: A Little Help needed climbing 3-5 steps with a railing? : A Little 6 Click Score: 22    End of Session Equipment  Utilized During Treatment: Gait belt Activity Tolerance: Patient tolerated treatment well Patient left: in bed;with call bell/phone within reach;with family/visitor present Nurse Communication: Mobility status PT Visit Diagnosis: Other abnormalities of gait and mobility (R26.89);Unsteadiness on feet (R26.81)    Time: 1350-1410 PT Time Calculation (min) (ACUTE ONLY): 20 min   Charges:   PT Evaluation $PT Eval Low Complexity: 1 Low   PT General Charges $$ ACUTE PT VISIT: 1 Visit         Creed Copper Fairly, PT, DPT 06/12/23 2:27 PM

## 2023-06-12 NOTE — Assessment & Plan Note (Signed)
BP stable  Titrate BP regimen

## 2023-06-12 NOTE — Telephone Encounter (Signed)
Patient Product/process development scientist completed.    The patient is insured through [facility]. Patient has Medicare and is not eligible for a copay card, but may be able to apply for patient assistance, if available.    Ran test claim for Eliquis 5 mg and the current 30 day co-pay is $152.71 due to a deductible.   This test claim was processed through [facility]- copay amounts may vary at other pharmacies due to pharmacy/plan contracts, or as the patient moves through the different stages of their insurance plan.     Roland Earl, CPHT Pharmacy Technician III Certified Patient Advocate [facility] Patient Advocate Team Direct Number: [phone]  Fax: [phone]

## 2023-06-12 NOTE — Consult Note (Signed)
NEUROLOGY CONSULT NOTE   Date of service: June 12, 2023 Patient Name: Pamela Garner MRN:  [id number] DOB:  03-28-1941 Chief Complaint: "headache, vision problems" Requesting Provider: Hannah Beat, MD  History of Present Illness  Pamela Garner is a 82 y.o. 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     She was in her usual state of health when she went to bed Thursday night other than perhaps for the past 2 months some increased fatigue on exertion.  When she woke up on Friday morning she had pain on the right side of her face and felt like she was leaning to the right a bit due to the pain/heaviness in her right eye.  While driving to dinner with her husband she was noted to have difficulty seeing things particularly in the left visual field, hitting curbs and not seeing obstacles well but fortunately avoiding any serious accidents or injuries.  Her headache improved but as the vision issues did not improve she went to her doctor for further evaluation and was sent to the ED for workup where MRI revealed stroke and she was also found to have a new diagnosis of atrial fibrillation.  Neurology is asked to consult  She has no other symptoms, and no other recent transient neurological events or systemic complaints  LKW: Thursday evening 12/12, symptom discovery 12/13 at 9 AM Modified rankin score: 0 IV Thrombolysis: Out of the window, No EVT: No, out of the window   NIHSS components Score: Comment  1a Level of Conscious 0[x]  1[]  2[]  3[]      1b LOC Questions 0[x]  1[]  2[]       1c LOC Commands 0[x]  1[]  2[]       2 Best Gaze 0[x]  1[]  2[]       3 Visual 0[x]  1[]  2[]  3[]     Slight loss of right inferior peripheral visual field but intact on finger counting  4 Facial Palsy 0[x]  1[]  2[]  3[]      5a Motor Arm - left 0[x]  1[]  2[]  3[]  4[]   UN[]    5b Motor Arm - Right 0[x]  1[]  2[]  3[]  4[]  UN[]    6a Motor Leg - Left 0[x]  1[]  2[]  3[]  4[]  UN[]    6b Motor Leg - Right 0[x]  1[]  2[]  3[]  4[]  UN[]    7 Limb Ataxia 0[x]  1[]  2[]  3[]  UN[]     8 Sensory 0[x]  1[]  2[]  UN[]      9 Best Language 0[x]  1[]  2[]  3[]      10 Dysarthria 0[x]  1[]  2[]  UN[]      11 Extinct. 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 She has never used smokeless tobacco. She reports that she does not drink alcohol and does not use drugs.  Allergies  Allergen Reactions   Chlorpheniramine-Phenylephrine Rash    Medications   Current Facility-Administered Medications:    [START ON 06/13/2023]  stroke: early stages of recovery book, , Does not apply, Once, Floydene Flock, MD   0.9 %  sodium chloride infusion, , Intravenous, Continuous, Alvester Morin Francoise Schaumann, MD   acetaminophen (TYLENOL) tablet 650 mg, 650 mg, Oral, Q4H PRN **OR** acetaminophen (TYLENOL) 160 MG/5ML solution 650 mg, 650 mg, Per Tube, Q4H PRN **OR** acetaminophen (TYLENOL) suppository 650 mg, 650 mg, Rectal, Q4H PRN, Floydene Flock, MD    atorvastatin (LIPITOR) tablet 80 mg, 80 mg, Oral, Daily, Floydene Flock, MD, 80 mg at 06/12/23 0954   enoxaparin (LOVENOX) injection 40 mg, 40 mg, Subcutaneous, Q24H, Floydene Flock, MD, 40 mg at 06/12/23 0954   levothyroxine (SYNTHROID) tablet 112 mcg, 112 mcg, Oral, Q0600, Floydene Flock, MD, 112 mcg at 06/12/23 0954   pantoprazole (PROTONIX) EC tablet 40 mg, 40 mg, Oral, Daily, Floydene Flock, MD, 40 mg at 06/12/23 0954   senna-docusate (Senokot-S) tablet 1 tablet, 1 tablet, Oral, QHS PRN, Floydene Flock, MD  Current Outpatient Medications:    clobetasol ointment (TEMOVATE) 0.05 %, Apply to affected area every night for 2 weeks then apply 3 x/week, Disp: 30 g, Rfl: 1   Cyanocobalamin (B-12 PO), Take by mouth daily., Disp: , Rfl:    FINACEA 15 % cream, APPLY ON THE SKIN DAILY, Disp: , Rfl: 0   ibuprofen (ADVIL) 800 MG tablet, Take 800 mg by mouth every 8 (eight) hours as needed., Disp: , Rfl:    lisinopril (PRINIVIL,ZESTRIL) 20 MG tablet, , Disp: , Rfl:    naproxen sodium (ALEVE) 220 MG tablet, Take by mouth., Disp: , Rfl:    OXcarbazepine (TRILEPTAL) 150 MG tablet, Take 150 mg by mouth 2 (two) times daily., Disp: , Rfl:    SYNTHROID 112 MCG tablet, , Disp: , Rfl:   Vitals   Vitals:   06/12/23 0800 06/12/23 0900 06/12/23 0946 06/12/23 0957  BP: 137/67 (!) 141/58    Pulse: 61 64 92   Resp: 17 16 16    Temp:    97.7 F (36.5 C)  TempSrc:    Oral  SpO2: 96% 95% 99%     There is no height or weight on file to calculate BMI.  Physical Exam   Constitutional: Appears well-developed and well-nourished.  Psych: Affect appropriate to situation.  Eyes: No scleral injection.  HENT: No OP obstruction.  Head: Normocephalic. Cardiovascular: Normal rate and regular rhythm.  Respiratory: Effort normal, non-labored breathing  GI: Soft.  No distension. There is no tenderness.  Skin: WDI.   Neurologic Examination   Neuro: Mental Status: Patient is awake, alert, oriented to  person, place, month, year, and situation. Patient is able to give a clear and coherent history. No signs of aphasia or neglect Cranial Nerves: II: Visual Fields are full. Pupils are equal, round, and reactive to light.   III,IV, VI: EOMI without ptosis or diploplia.  V: Facial sensation is symmetric to temperature  VII: Facial movement is symmetric.  VIII: hearing is intact to voice X: Uvula elevates symmetrically XII: tongue is midline without atrophy or fasciculations.  Motor: Tone is normal. Bulk is normal. 5/5 strength was present in all four extremities.  Sensory: Sensation is symmetric to light touch and temperature in the arms and legs. Cerebellar: FNF and HKS are intact bilaterally Gait: Slightly wide-based stance on standing, able to rise on heels and toes.  Family and patient report that her gait is at her baseline although has been feels it may be a little slower than normal which patient and daughter attribute to knee pain, noting that she is scheduled for knee replacement surgery in January  Labs/Imaging/Neurodiagnostic studies   CBC:  Recent Labs  Lab 06/29/23 1409  WBC 9.3  NEUTROABS 6.3  HGB 14.7  HCT 44.6  MCV 94.3  PLT 253   Basic Metabolic Panel:  Lab Results  Component Value Date   NA 130 (L) Jun 29, 2023   K 3.9 06-29-2023   CO2 25 06/29/2023   GLUCOSE 149 (H) Jun 29, 2023   BUN 24 (H) June 29, 2023   CREATININE 0.90 06-29-23   CALCIUM 9.0 06/29/23   GFRNONAA >60 06/29/2023   Lipid Panel: No results found for: "LDLCALC" HgbA1c: No results found for: "HGBA1C" Urine Drug Screen: No results found for: "LABOPIA", "COCAINSCRNUR", "LABBENZ", "AMPHETMU", "THCU", "LABBARB"  Alcohol Level No results found for: "ETH" INR No results found for: "INR" APTT No results found for: "APTT" AED levels: No results found for: "PHENYTOIN", "ZONISAMIDE", "LAMOTRIGINE", "LEVETIRACETA"  CT Head without contrast(Personally reviewed): 1. 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 2. No hemodynamically significant stenosis of the neck.  MRI Brain(Personally reviewed): Moderate size acute infarct in the right occipital parietal cortex.   ASSESSMENT   Dejanai Schriver is a 82 y.o. 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  Presenting with new onset atrial fibrillation complicated by right PCA stroke which has affected her peripheral left inferior quadrant vision.  Given the moderate size of the stroke and the fact that it has already been 4 days, with no significant hemorrhagic conversion on imaging, risk of starting anticoagulation at this time is outweighed by the benefit and she is okay to start anticoagulation from a neurological perspective.  RECOMMENDATIONS  -Eliquis 5 mg twice daily -- holding off on this order in case she needs any other cardiac workup etc. for which Eliquis may be a barrier, -No need for concurrent antiplatelet agent for a neurological perspective, patient counseled not to use aspirin 325 mg for pain and to utilize Tylenol instead as she will be starting Eliquis -LDL goal less than 70, agree with atorvastatin 80 mg nightly -A1c goal less than 7% -Echocardiogram pending -SLP evaluation canceled given patient has no cognitive/speech/swallowing issues -PT/OT evaluations pending -Neurochecks per standard protocol, telemetry inpatient -Consider outpatient sleep study given body habitus and age -Appreciate cardiology evaluation and primary team management of comorbidities -No need for permissive hypertension as she is out of the window, goal gradual normotension over 3 to 5 days -No other inpatient neurological workup is needed at this time from a stroke perspective.  I will  sign off but please do not hesitate to reach out if questions or concerns arise -Long discussion with patient and family at  bedside about plan, medications, potential side effects, and "BE FAST" mnemonic reviewed -Discussed with primary team via secure chat ______________________________________________________________________    Signed, Gordy Councilman, MD [facility]

## 2023-06-12 NOTE — Assessment & Plan Note (Signed)
New onset rate controlled atrial fibrillation w/ RBBB on EKG in setting of CVA  Will add on 2D ECHO  CHADsVASc score 4  Defer antiplatelet treatment in setting of CVA eval  Follow up cardiology recommendations

## 2023-06-12 NOTE — H&P (Addendum)
History and Physical    Patient: Pamela Garner YQI:[id number] DOB: [birth year] DOA: 06/12/2023 DOS: the patient was seen and examined on 06/12/2023 PCP: Pamela Ferrier, MD  Patient coming from: Home  Chief Complaint:  Chief Complaint  Patient presents with   Diplopia   HPI: Pamela Garner is a 82 y.o. female with medical history significant of obesity, hypertension, hyperlipidemia, thyroid disease presenting with CVA and new onset atrial fibrillation.  Patient reports diplopia for roughly 4 to 5 days ago.  Initially woke up in the morning with significant frontal headache as well as double vision.  Positive mild confusion.  Was able to drive herself to an event though she does remember how she got there.  Minimal confusion.  No focal hemiparesis.  Patient denies any prior episodes like this in the past.  No reported tobacco or alcohol use.  No chest pain or shortness of breath.  No palpitations.  No abdominal pain.  No nausea or vomiting.  Baseline hypertension.  Patient reports overall stable control. Diplopia has persisted with mild worsening confusion over past 1-2 days.  Presented to the ER afebrile, hemodynamically stable.  Satting well on room air.  White count 9.3, hemoglobin 14.7, platelets 253, creatinine 0.9, glucose 149, troponin negative x 1.  Initial CT head concerning for possible hyperdense right MCA.  CTA of the head neck with severe stenosis or occlusion of P1 segment of the left PCA.  MRI of the brain with moderate-sized acute infarct in the right occipital parietal cortex. Review of Systems: As mentioned in the history of present illness. All other systems reviewed and are negative. Past Medical History:  Diagnosis Date   Degenerative joint disease    Dermatophytosis, nail    feet   Hyperlipidemia    Hypertension    Lichen sclerosus    Thyroid disease    Trigeminal neuralgia of left side of face    Past Surgical History:  Procedure Laterality Date   COLONOSCOPY   2008   DILATION AND CURETTAGE OF UTERUS  1985   endometrial polyps   Social History:  reports that she has never smoked. She has never used smokeless tobacco. She reports that she does not drink alcohol and does not use drugs.  Allergies  Allergen Reactions   Chlorpheniramine-Phenylephrine Rash    Family History  Problem Relation Age of Onset   Lung cancer Father 67   Raynaud syndrome Sister    Cancer Maternal Grandmother        unknown primary?stomach   Diabetes Brother    Hypertension Brother    Alzheimer's disease Brother    Kidney cancer Brother    Cancer Son 55       ?leukemia (neck)   Breast cancer Neg Hx     Prior to Admission medications   Medication Sig Start Date End Date Taking? Authorizing Provider  clobetasol ointment (TEMOVATE) 0.05 % Apply to affected area every night for 2 weeks then apply 3 x/week 10/14/19  Yes Farrel Conners, CNM  Cyanocobalamin (B-12 PO) Take by mouth daily.   Yes [provider]  FINACEA 15 % cream APPLY ON THE SKIN DAILY 05/11/17  Yes [provider]  ibuprofen (ADVIL) 800 MG tablet Take 800 mg by mouth every 8 (eight) hours as needed. 12/11/21  Yes [provider]  lisinopril (PRINIVIL,ZESTRIL) 20 MG tablet  05/09/17  Yes [provider]  naproxen sodium (ALEVE) 220 MG tablet Take by mouth.   Yes [provider]  OXcarbazepine (  TRILEPTAL) 150 MG tablet Take 150 mg by mouth 2 (two) times daily.   Yes [provider]  SYNTHROID 112 MCG tablet  05/09/17  Yes [provider]    Physical Exam: Vitals:   06/12/23 0515 06/12/23 0630 06/12/23 0700 06/12/23 0800  BP:   (!) 157/90 137/67  Pulse: 71 78 90 61  Resp: 19 15 15 17   Temp:      TempSrc:      SpO2: 96% 96% 96% 96%   Physical Exam Constitutional:      Appearance: She is obese.  HENT:     Head: Normocephalic and atraumatic.  Eyes:     Pupils: Pupils are equal, round, and reactive to light.     Comments: Unable to  track finger movements    Cardiovascular:     Rate and Rhythm: Normal rate and regular rhythm.  Pulmonary:     Effort: Pulmonary effort is normal.  Abdominal:     General: Bowel sounds are normal.  Musculoskeletal:        General: Normal range of motion.  Skin:    General: Skin is warm.  Neurological:     General: No focal deficit present.  Psychiatric:        Mood and Affect: Mood normal.     Data Reviewed:  There are no new results to review at this time.  MR BRAIN WO CONTRAST CLINICAL DATA:  Stroke follow-up for double vision.  EXAM: MRI HEAD WITHOUT CONTRAST  TECHNIQUE: Multiplanar, multiecho pulse sequences of the brain and surrounding structures were obtained without intravenous contrast.  COMPARISON:  None Available.  FINDINGS: Brain: Restricted diffusion involving the right occipital parietal cortex. A moderate area is affected. Chronic small vessel ischemia in the cerebral white matter and pons which is mild for age. Small chronic left cerebellar infarct. No hemorrhage, hydrocephalus, mass, or collection.  Vascular: Major flow voids are preserved  Skull and upper cervical spine: Degenerative facet spurring with low T1 signal at the right C3-4 facet, usually degenerative marrow edema.  Sinuses/Orbits: Negative  IMPRESSION: Moderate size acute infarct in the right occipital parietal cortex.  Electronically Signed   By: Tiburcio Pea M.D.   On: 06/12/2023 04:04 CT ANGIO HEAD NECK W WO CM CLINICAL DATA:  Diplopia  EXAM: CT ANGIOGRAPHY HEAD AND NECK WITH AND WITHOUT CONTRAST  TECHNIQUE: Multidetector CT imaging of the head and neck was performed using the standard protocol during bolus administration of intravenous contrast. Multiplanar CT image reconstructions and MIPs were obtained to evaluate the vascular anatomy. Carotid stenosis measurements (when applicable) are obtained utilizing NASCET criteria, using the distal internal carotid  diameter as the denominator.  RADIATION DOSE REDUCTION: This exam was performed according to the departmental dose-optimization program which includes automated exposure control, adjustment of the mA and/or kV according to patient size and/or use of iterative reconstruction technique.  CONTRAST:  75mL OMNIPAQUE IOHEXOL 350 MG/ML SOLN  COMPARISON:  None Available.  FINDINGS: CTA NECK FINDINGS  Skeleton: No acute abnormality or high grade bony spinal canal stenosis.  Other neck: Normal pharynx, larynx and major salivary glands. No cervical lymphadenopathy. Unremarkable thyroid gland.  Upper chest: No pneumothorax or pleural effusion. No nodules or masses.  Aortic arch: There is no calcific atherosclerosis of the aortic arch. Conventional 3 vessel aortic branching pattern.  RIGHT carotid system: Normal without aneurysm, dissection or stenosis.  LEFT carotid system: Normal without aneurysm, dissection or stenosis.  Vertebral arteries: Left dominant configuration. There is no dissection, occlusion  or flow-limiting stenosis to the skull base (V1-V3 segments).  CTA HEAD FINDINGS  POSTERIOR CIRCULATION: Mild atherosclerotic calcification of both V4 segments without hemodynamically significant stenosis. No proximal occlusion of the anterior or inferior cerebellar arteries. Basilar artery is normal. Superior cerebellar arteries are normal. The right PCA is normal. There is a partial fetal origin of the left PCA. The normal P1 segment is severely stenotic or occluded at the left P1 2 junction. There is also severe stenosis of the distal left P-comm. Distal left PCA is normal.  ANTERIOR CIRCULATION: Atherosclerotic calcification of the internal carotid arteries at the skull base without hemodynamically significant stenosis. Anterior cerebral arteries are normal. Middle cerebral arteries are normal.  Venous sinuses: As permitted by contrast timing, patent.  Anatomic  variants: Partial fetal origin of the left posterior cerebral artery.  Review of the MIP images confirms the above findings.  IMPRESSION: 1. 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 2. No hemodynamically significant stenosis of the neck.  Electronically Signed   By: Deatra Robinson M.D.   On: 06/12/2023 02:12  Lab Results  Component Value Date   WBC 9.3 06/11/2023   HGB 14.7 06/11/2023   HCT 44.6 06/11/2023   MCV 94.3 06/11/2023   PLT 253 06/11/2023   Last metabolic panel Lab Results  Component Value Date   GLUCOSE 149 (H) 06/11/2023   NA 130 (L) 06/11/2023   K 3.9 06/11/2023   CL 96 (L) 06/11/2023   CO2 25 06/11/2023   BUN 24 (H) 06/11/2023   CREATININE 0.90 06/11/2023   GFRNONAA >60 06/11/2023   CALCIUM 9.0 06/11/2023   ANIONGAP 9 06/11/2023    Assessment and Plan: Atrial fibrillation (HCC) New onset rate controlled atrial fibrillation w/ RBBB on EKG in setting of CVA  Will add on 2D ECHO  CHADsVASc score 4  Defer antiplatelet treatment in setting of CVA eval  Follow up cardiology recommendations     CVA (cerebral vascular accident) (HCC) Diplopia, confusion, headache x 4 to 5 days with noted Moderate size acute infarct in the right occipital parietal cortex on MRI of the brain Noted Severe stenosis or 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 at the left P1-2 junction on CTA head and neck  Will plan for formal CVA evaluation including 2D ECHO, risk stratification labs  Plan for formal neurology consultation  S/p full dose asa  Defer antiplatelet treatment for now in setting of new onset atrial fibrillation  Follow up neuro and cards recommendations     GERD (gastroesophageal reflux disease) PPI   Hypertension BP stable  Titrate BP regimen    Thyroid disease Cont synthroid        Advance Care Planning:    Code Status: Full Code   Consults: Neurology, Cardiology   Family Communication: No family at the bedside   Severity of Illness: The appropriate patient status for this patient is INPATIENT. Inpatient status is judged to be reasonable and necessary in order to provide the required intensity of service to ensure the patient's safety. The patient's presenting symptoms, physical exam findings, and initial radiographic and laboratory data in the context of their chronic comorbidities is felt to place them at high risk for further clinical deterioration. Furthermore, it is not anticipated that the patient will be medically stable for discharge from the hospital within 2 midnights of admission.   *  I certify that at the point of admission it is my clinical judgment that the patient will require inpatient hospital care spanning beyond 2 midnights from the point of admission due to high intensity of service, high risk for further deterioration and high frequency of surveillance required.*  Author: Floydene Flock, MD 06/12/2023 8:31 AM  For on call review [email or web address].

## 2023-06-12 NOTE — Progress Notes (Addendum)
SLP Cancellation Note  Patient Details Name: Pamela Garner MRN: [id number] DOB: Apr 20, 1941   Cancelled treatment:       Reason Eval/Treat Not Completed: SLP screened, no needs identified, will sign off (chart reviewed; met w/ pt and Family in room.)  Pt denied any difficulty swallowing and is currently on a regular diet; tolerates swallowing pills w/ water per NSG. Pt conversed in conversation w/ both SLP and Family w/out expressive/receptive deficits noted; pt denied any speech-language deficits. Speech clear. No further skilled ST services indicated as pt appears at her baseline. Pt agreed. NSG to reconsult if any change in status while admitted.     Jerilynn Som, MS, CCC-SLP Speech Language Pathologist Rehab Services; [facility] [phone] (ascom) [name] 06/12/2023, 11:31 AM
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
## 2023-06-12 NOTE — Assessment & Plan Note (Signed)
statin

## 2023-06-12 NOTE — Assessment & Plan Note (Signed)
Diplopia, confusion, headache x 4 to 5 days with noted Moderate size acute infarct in the right occipital parietal cortex on MRI of the brain Noted Severe stenosis or 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 at the left P1-2 junction on CTA head and neck  Will plan for formal CVA evaluation including 2D ECHO, risk stratification labs  Plan for formal neurology consultation  S/p full dose asa  Defer antiplatelet treatment for now in setting of new onset atrial fibrillation  Follow up neuro and cards recommendations

## 2023-06-13 ENCOUNTER — Inpatient Hospital Stay
Admit: 2023-06-13 | Discharge: 2023-06-13 | Disposition: A | Discharge status: Home / Self Care | Payer: Medicare Other | Attending: Medical

## 2023-06-13 ENCOUNTER — Inpatient Hospital Stay (HOSPITAL_COMMUNITY)
Admit: 2023-06-13 | Discharge: 2023-06-13 | Disposition: A | Discharge status: Home / Self Care | Payer: Medicare Other | Attending: Family Medicine | Admitting: Family Medicine

## 2023-06-13 ENCOUNTER — Other Ambulatory Visit: Payer: Self-pay

## 2023-06-13 DIAGNOSIS — I480 Paroxysmal atrial fibrillation: Secondary | ICD-10-CM | POA: Diagnosis not present

## 2023-06-13 DIAGNOSIS — I6389 Other cerebral infarction: Secondary | ICD-10-CM | POA: Diagnosis not present

## 2023-06-13 DIAGNOSIS — I639 Cerebral infarction, unspecified: Secondary | ICD-10-CM | POA: Diagnosis not present

## 2023-06-13 DIAGNOSIS — H532 Diplopia: Secondary | ICD-10-CM | POA: Diagnosis not present

## 2023-06-13 DIAGNOSIS — I4892 Unspecified atrial flutter: Secondary | ICD-10-CM | POA: Diagnosis not present

## 2023-06-13 LAB — BASIC METABOLIC PANEL
Anion gap: 11 (ref 5–15)
BUN: 19 mg/dL (ref 8–23)
CO2: 23 mmol/L (ref 22–32)
Calcium: 9.7 mg/dL (ref 8.9–10.3)
Chloride: 99 mmol/L (ref 98–111)
Creatinine, Ser: 0.78 mg/dL (ref 0.44–1.00)
GFR, Estimated: >60 mL/min (ref >60)
Glucose, Bld: 118 mg/dL — ABNORMAL HIGH (ref 70–99)
Potassium: 4.4 mmol/L (ref 3.5–5.1)
Sodium: 133 mmol/L — ABNORMAL LOW (ref 135–145)

## 2023-06-13 LAB — ECHOCARDIOGRAM COMPLETE
AR max vel: 3.62 cm2
AV Area VTI: 4.08 cm2
AV Area mean vel: 2.9 cm2
AV Mean grad: 2 mm[Hg]
AV Peak grad: 2.8 mm[Hg]
Ao pk vel: 0.83 m/s
Area-P 1/2: 6.07 cm2
Height: 63 in
S' Lateral: 2.9 cm
Weight: 3056 [oz_av]

## 2023-06-13 LAB — PHOSPHORUS: Phosphorus: 3.5 mg/dL (ref 2.5–4.6)

## 2023-06-13 LAB — MAGNESIUM: Magnesium: 1.7 mg/dL (ref 1.7–2.4)

## 2023-06-13 LAB — LIPID PANEL
Cholesterol: 184 mg/dL (ref 0–200)
HDL: 52 mg/dL (ref >40)
LDL Cholesterol: 110 mg/dL — ABNORMAL HIGH (ref 0–99)
Total CHOL/HDL Ratio: 3.5 {ratio}
Triglycerides: 108 mg/dL (ref <150)
VLDL: 22 mg/dL (ref 0–40)

## 2023-06-13 MED ORDER — ATORVASTATIN CALCIUM 80 MG PO TABS: 80.0000 mg | ORAL_TABLET | Freq: Every day | ORAL | 11 refills | Status: DC

## 2023-06-13 MED ORDER — METOPROLOL SUCCINATE ER 25 MG PO TB24: 12.5000 mg | ORAL_TABLET | Freq: Two times a day (BID) | ORAL | 11 refills | Status: AC

## 2023-06-13 MED ORDER — APIXABAN 5 MG PO TABS: 5.0000 mg | ORAL_TABLET | Freq: Two times a day (BID) | ORAL | 11 refills | Status: DC

## 2023-06-13 MED ORDER — METOPROLOL SUCCINATE ER 25 MG PO TB24
12.5000 mg | ORAL_TABLET | Freq: Two times a day (BID) | ORAL | Status: DC
  Administered 2023-06-13: 12.5 mg via ORAL
  Filled 2023-06-13: qty 1

## 2023-06-13 NOTE — Progress Notes (Signed)
Rounding Note    Patient Name: Pamela Garner Date of Encounter: 06/13/2023  [facility] Cardiologist: Yvonne Kendall, MD   Subjective   Patient was in sinus rhythm until 0330-0340 this morning when she went back into atrial flutter with controlled rates. She is asymptomatic from a cardiac standpoint.   Inpatient Medications    Scheduled Meds:   stroke: early stages of recovery book   Does not apply Once   apixaban  5 mg Oral BID   atorvastatin  80 mg Oral QHS   levothyroxine  112 mcg Oral Q0600   pantoprazole  40 mg Oral Daily   Continuous Infusions:  PRN Meds: acetaminophen **OR** acetaminophen (TYLENOL) oral liquid 160 mg/5 mL **OR** acetaminophen, senna-docusate   Vital Signs    Vitals:   06/13/23 0711 06/13/23 0715 06/13/23 0739 06/13/23 0830  BP:  137/75 137/71   Pulse:  62 77   Resp:  16 17   Temp:    97.8 F (36.6 C)  TempSrc:      SpO2: 96% 95% 97%   Weight:      Height:       No intake or output data in the 24 hours ending 06/13/23 0846    06/12/2023    7:24 PM 01/06/2020    3:05 PM 11/25/2019   10:15 AM  Last 3 Weights  Weight (lbs) 191 lb 191 lb 190 lb 6.4 oz  Weight (kg) 86.637 kg 86.637 kg 86.365 kg      Telemetry    Sinus rhythm rate 60-80 bpm until ~0330-0340 this morning when she went back into atrial flutter rate 70-90 bpm - Personally Reviewed  Physical Exam   GEN: No acute distress.   Neck: No JVD Cardiac: RRR, no murmurs, rubs, or gallops.  Respiratory: Clear to auscultation bilaterally. GI: Soft, nontender, non-distended  MS: No edema; No deformity. Neuro:  Nonfocal  Psych: Normal affect   Labs    High Sensitivity Troponin:   Recent Labs  Lab 06/11/23 1409  TROPONINIHS 7     Chemistry Recent Labs  Lab 06/11/23 1409 06/12/23 1449  NA 130*  --   K 3.9  --   CL 96*  --   CO2 25  --   GLUCOSE 149*  --   BUN 24*  --   CREATININE 0.90  --   CALCIUM 9.0  --   MG  --  1.4*  GFRNONAA >60  --   ANIONGAP  9  --     Lipids  Recent Labs  Lab 06/13/23 0521  CHOL 184  TRIG 108  HDL 52  LDLCALC 110*  CHOLHDL 3.5    Hematology Recent Labs  Lab 06/11/23 1409  WBC 9.3  RBC 4.73  HGB 14.7  HCT 44.6  MCV 94.3  MCH 31.1  MCHC 33.0  RDW 12.6  PLT 253   Thyroid  Recent Labs  Lab 06/12/23 1449  TSH 1.442    BNPNo results for input(s): "BNP", "PROBNP" in the last 168 hours.  DDimer No results for input(s): "DDIMER" in the last 168 hours.   Radiology    MR BRAIN WO CONTRAST Result Date: 06/12/2023 CLINICAL DATA:  Stroke follow-up for double vision. EXAM: MRI HEAD WITHOUT CONTRAST TECHNIQUE: Multiplanar, multiecho pulse sequences of the brain and surrounding structures were obtained without intravenous contrast. COMPARISON:  None Available. FINDINGS: Brain: Restricted diffusion involving the right occipital parietal cortex. A moderate area is affected. Chronic small vessel ischemia in the cerebral white  matter and pons which is mild for age. Small chronic left cerebellar infarct. No hemorrhage, hydrocephalus, mass, or collection. Vascular: Major flow voids are preserved Skull and upper cervical spine: Degenerative facet spurring with low T1 signal at the right C3-4 facet, usually degenerative marrow edema. 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 Electronically Signed   By: Tiburcio Pea M.D.   On: 06/12/2023 04:04   CT ANGIO HEAD NECK W WO CM Result Date: 06/12/2023 CLINICAL DATA:  Diplopia EXAM: CT ANGIOGRAPHY HEAD AND NECK WITH AND WITHOUT CONTRAST TECHNIQUE: Multidetector CT imaging of the head and neck was performed using the standard protocol during bolus administration of intravenous contrast. Multiplanar CT image reconstructions and MIPs were obtained to evaluate the vascular anatomy. Carotid stenosis measurements (when applicable) are obtained utilizing NASCET criteria, using the distal internal carotid diameter as  the denominator. RADIATION DOSE REDUCTION: This exam was performed according to the departmental dose-optimization program which includes automated exposure control, adjustment of the mA and/or kV according to patient size and/or use of iterative reconstruction technique. CONTRAST:  75mL OMNIPAQUE IOHEXOL 350 MG/ML SOLN COMPARISON:  None Available. FINDINGS: CTA NECK FINDINGS Skeleton: No acute abnormality or high grade bony spinal canal stenosis. Other neck: Normal pharynx, larynx and major salivary glands. No cervical lymphadenopathy. Unremarkable thyroid gland. Upper chest: No pneumothorax or pleural effusion. No nodules or masses. Aortic arch: There is no calcific atherosclerosis of the aortic arch. Conventional 3 vessel aortic branching pattern. RIGHT carotid system: Normal without aneurysm, dissection or stenosis. LEFT carotid system: Normal without aneurysm, dissection or stenosis. Vertebral arteries: Left dominant configuration. There is no dissection, occlusion or flow-limiting stenosis to the skull base (V1-V3 segments). CTA HEAD FINDINGS POSTERIOR CIRCULATION: Mild atherosclerotic calcification of both V4 segments without hemodynamically significant stenosis. No proximal occlusion of the anterior or inferior cerebellar arteries. Basilar artery is normal. Superior cerebellar arteries are normal. The right PCA is normal. There is a partial fetal origin of the left PCA. The normal P1 segment is severely stenotic or occluded at the left P1 2 junction. There is also severe stenosis of the distal left P-comm. Distal left PCA is normal. ANTERIOR CIRCULATION: Atherosclerotic calcification of the internal carotid arteries at the skull base without hemodynamically significant stenosis. Anterior cerebral arteries are normal. Middle cerebral arteries are normal. Venous sinuses: As permitted by contrast timing, patent. Anatomic variants: Partial fetal origin of the left posterior cerebral artery. Review of the MIP  images confirms the above findings. IMPRESSION: 1. 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 2. No hemodynamically significant stenosis of the neck. Electronically Signed   By: Deatra Robinson M.D.   On: 06/12/2023 02:12   CT Head Wo Contrast Result Date: 06/11/2023 CLINICAL DATA:  Headache, neuro deficit EXAM: CT HEAD WITHOUT CONTRAST TECHNIQUE: Contiguous axial images were obtained from the base of the skull through the vertex without intravenous contrast. RADIATION DOSE REDUCTION: This exam was performed according to the departmental dose-optimization program which includes automated exposure control, adjustment of the mA and/or kV according to patient size and/or use of iterative reconstruction technique. COMPARISON:  Head CT 12/11/21 FINDINGS: Brain: No hemorrhage. No hydrocephalus. No extra-axial fluid collection. No CT evidence of an acute cortical infarct mass effect. No mass lesion. There is a background of mild chronic microvascular ischemic change. Vascular: Possible hyperdense right MCA Skull: Normal. Negative for fracture  or focal lesion. Sinuses/Orbits: No middle ear or mastoid effusion. Paranasal sinuses are clear. Orbits are unremarkable. Other: None. IMPRESSION: 1.  No hemorrhage or CT evidence of an acute cortical infarct. 2. Possible hyperdense right MCA. Recommend CTA head/neck for further evaluation. Electronically Signed   By: Lorenza Cambridge M.D.   On: 06/11/2023 16:01    Cardiac Studies   06/13/23 Echocardiogram Results pending  Patient Profile     Pamela Garner is a 82 y.o. female with a hx of obesity, hypertension, hyperlipidemia, thyroid disease, and new CVA who is being seen for the continued evaluation of new onset atrial fib/flutter.   Assessment & Plan    Atrial fibrillation/flutter - Presented with new onset atrial fib/flutter with controlled rates 70-90 bpm,  converted to sinus rhythm around 0730 on 12/17 - Patient is back in atrial flutter controlled rates 70-90 bpm now, flipped back in around 0330-0340 this morning - EKG shows atrial flutter rate 72 with RBBB - CHA2DS2VASc score 6 (age, sex, stroke, HTN) - Echo results pending, unofficial read per Dr. 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

## 2023-06-13 NOTE — Progress Notes (Addendum)
Occupational Therapy Treatment Patient Details Name: Pamela Garner MRN: [id number] DOB: 05-Jun-1941 Today's Date: 06/13/2023   History of present illness Pamela Garner is a 82 y.o. female with medical history significant of obesity, hypertension, hyperlipidemia, thyroid disease presenting with CVA and new onset atrial fibrillation.  Patient reports diplopia formation roughly 4 to 5 days ago.  Had woke up in the morning with significant frontal headache as well as double vision. CTA of the head neck with severe stenosis or occlusion of P1 segment of the left PCA.  MRI of the brain with moderate-sized acute infarct in the right occipital parietal cortex.   OT comments  Chart reviewed, pt greeted at edge of bed, agreeable to OT tx session targeting further assessing cognitive performance. 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 Of note, it is not within occupational therapy scope of practice to diagnose cognitive impairments, this screen indicates need for further testing. The SLUMS is a 30 point, 11 question screening questionnaire that tests orientation, memory, attention, and executive function. Pt with noted impairments in short term memory, problem solving, and executive function. Discussed findings with pt, she reports she struggles with memory. Notified MD of findings. Of note, pt is performing ADLs at/close to baseline, would recommend supervision for med management. Pt is left on edge of bed, all needs met. OT will follow acutely.   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       If plan is discharge home, recommend the following:  Assist for transportation;Direct supervision/assist for medications management;Assistance with cooking/housework   Equipment Recommendations  Tub/shower bench    Recommendations for Other Services      Precautions / Restrictions Precautions Precautions: Fall       Mobility Bed Mobility Overal bed mobility: Modified Independent                  Transfers                         Balance Overall balance assessment: Needs assistance Sitting-balance support: Feet supported Sitting balance-Leahy Scale: Normal                                     ADL either performed or assessed with clinical judgement   ADL                                              Extremity/Trunk Assessment              Vision       Perception     Praxis      Cognition Arousal: Alert Behavior During Therapy: WFL for tasks assessed/performed Overall Cognitive Status: No family/caregiver present to determine baseline cognitive functioning Area of Impairment: Problem solving  Problem Solving: Requires verbal cues General Comments: pt participated in SLUMS, scoring 22/30        Exercises Other Exercises Other Exercises: edu re: findings on slums    Shoulder Instructions       General Comments      Pertinent Vitals/ Pain       Pain Assessment Pain Assessment: No/denies pain  Home Living                                          Prior Functioning/Environment              Frequency  Min 1X/week        Progress Toward Goals  OT Goals(current goals can now be found in the care plan section)  Progress towards OT goals: Progressing toward goals  Acute Rehab OT Goals Time For Goal Achievement: 06/26/23  Plan      Co-evaluation                  AM-PAC OT "6 Clicks" Daily Activity     Outcome Measure   Help from another person eating meals?: None Help from another person taking care of personal grooming?: None Help from another person toileting, which includes using toliet, bedpan, or urinal?: None Help from another person bathing (including washing, rinsing, drying)?: A Little Help from another person to put on and taking off regular upper body clothing?: None Help from another person to put on and taking off regular lower body clothing?: A Lot 6 Click Score: 21    End of Session    OT Visit Diagnosis: Unsteadiness on feet (R26.81);Cognitive communication deficit (R41.841)   Activity Tolerance Patient tolerated treatment well   Patient Left in bed;with call bell/phone within reach   Nurse Communication Mobility status (slums score, MD re slums score)        Time: 6295-2841 OT Time Calculation (min): 10 min  Charges: OT General Charges $OT Visit: 1 Visit OT Treatments $Cognitive Funtion inital: Initial 15 mins  Oleta Mouse, OTD OTR/L  06/13/23, 2:36 PM

## 2023-06-13 NOTE — Discharge Summary (Signed)
[facility] Discharge Summary   Patient: Pamela Garner XBJ:[id number]  PCP: Lynnea Ferrier, MD  Date of admission: 06/12/2023   Date of discharge:  06/13/2023     Discharge Diagnoses:  Principal Problem:   Diplopia Active Problems:   Thyroid disease   Hypertension   Hyperlipidemia   GERD (gastroesophageal reflux disease)   CVA (cerebral vascular accident) (HCC)   Atrial fibrillation (HCC)   Admitted From: Home Disposition:  Home   Recommendations for Outpatient Follow-up:  Follow-up with PCP in 1 week, continue monitor BP at home and follow with PCP to titrate medication accordingly. Follow-up with neurology in 1 to 2 weeks Follow-up with cardiology in 1 to 2 weeks Follow up LABS/TEST:     Follow-up Information     Curtis Sites III, MD Follow up in 1 week(s).   Specialty: Internal Medicine Contact information: [address] [facility] [place] [address] [phone]         Antonieta Iba, MD Follow up in 1 week(s).   Specialty: Cardiology Contact information: [address] [place] [address] [phone]                Diet recommendation: Cardiac diet  Activity: The patient is advised to gradually reintroduce usual activities, as tolerated  Discharge Condition: stable  Code Status: Full code   History of present illness: As per the H and P dictated on admission Hospital Course:  Pamela Garner is a 82 y.o. female with medical history significant of obesity, hypertension, hyperlipidemia, thyroid disease presenting with CVA and new onset atrial fibrillation.  Patient reports diplopia for roughly 4 to 5 days ago.  Initially woke up in the morning with significant frontal headache as well as double vision.  Positive mild confusion.  Was able to drive herself to an event though she does remember how she got there.  Minimal confusion.  No focal hemiparesis.  Patient denies any prior episodes like this in  the past.  No reported tobacco or alcohol use.  No chest pain or shortness of breath.  No palpitations.  No abdominal pain.  No nausea or vomiting.  Baseline hypertension.  Patient reports overall stable control. Diplopia has persisted with mild worsening confusion over past 1-2 days.  Presented to the ER afebrile, hemodynamically stable.  Satting well on room air.  White count 9.3, hemoglobin 14.7, platelets 253, creatinine 0.9, glucose 149, troponin negative x 1.  Initial CT head concerning for possible hyperdense right MCA.  CTA of the head neck with severe stenosis or occlusion of P1 segment of the left PCA.  MRI of the brain with moderate-sized acute infarct in the right occipital parietal cortex.  Assessment and Plan:  # Atrial fibrillation/atrial flutter:  New onset rate controlled atrial fibrillation w/ RBBB on EKG in setting of CVA  CHADsVASc score 4, started Eliquis 5 mg p.o. twice daily for stroke prophylaxis. Patient was seen by cardiology, started metoprolol 12.5 mg p.o. twice daily. Patient was monitored on telemetry, heart rhythm flipped to normal sinus and then atrial flutter.  After beta-blocker rhythm was converted to sinus, rate was controlled.  Patient remained asymptomatic and she was cleared by cardiology to discharge with Zio patch for 14 days.  Patient was recommended to follow-up with cardiology as an outpatient and possible EP consult as an outpatient for ablation if remains in regular rhythm.  TTE LVEF 60-65%, moderate LV hypertrophy.  Atrial flutter rhythm.   # CVA (  cerebral vascular accident) Patient presented with diplopia, confusion, headache x 4 to 5 days with noted Moderate size acute infarct in the right occipital parietal cortex on MRI of the brain Noted Severe stenosis or 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 at the left P1-2 junction on CTA head and neck  TTE as above.  Negative PFO.  Patient was started on Eliquis 5 mg p.o. twice  daily due to new onset A-fib/a flutter, Lipitor 80 mg p.o. daily.  LDL 110, goal <70, TSH 1.4 within normal range. Patient was seen by neurology, recommended above management and follow-up as an outpatient.  # Hypertension: Started Lopressor 12.5 mg p.o. twice daily # Thyroid disease: Cont synthroid   Body mass index is 33.83 kg/m.  Nutrition Interventions:  - Patient was instructed, not to drive, operate heavy machinery, perform activities at heights, swimming or participation in water activities or provide baby sitting services while on Pain, Sleep and Anxiety Medications; until her outpatient Physician has advised to do so again.  - Also recommended to not to take more than prescribed Pain, Sleep and Anxiety Medications.  Patient was seen by physical therapy, who recommended no therapy needed on discharge On the day of the discharge the patient's vitals were stable, and no other acute medical condition were reported by patient. the patient was felt safe to be discharge at Home.  Consultants: Neurology and cardiology Procedures: None  Discharge Exam: General: Appear in no distress, no Rash; Oral Mucosa Clear, moist. Cardiovascular: S1 and S2 Present, no Murmur, Respiratory: normal respiratory effort, Bilateral Air entry present and no Crackles, no wheezes Abdomen: Bowel Sound present, Soft and no tenderness, no hernia Extremities: no Pedal edema, no calf tenderness Neurology: alert and oriented to time, place, and person affect appropriate.  Filed Weights   06/12/23 1924  Weight: 86.6 kg   Vitals:   06/13/23 1200 06/13/23 1300  BP:  (!) 115/99  Pulse:  68  Resp:  13  Temp: 98.3 F (36.8 C)   SpO2:  97%    DISCHARGE MEDICATION: Allergies as of 06/13/2023       Reactions   Chlorpheniramine-phenylephrine Rash        Medication List     STOP taking these medications    ibuprofen 800 MG tablet Commonly known as: ADVIL   lisinopril 20 MG tablet Commonly known  as: ZESTRIL   naproxen sodium 220 MG tablet Commonly known as: ALEVE       TAKE these medications    apixaban 5 MG Tabs tablet Commonly known as: ELIQUIS Take 1 tablet (5 mg total) by mouth 2 (two) times daily.   atorvastatin 80 MG tablet Commonly known as: LIPITOR Take 1 tablet (80 mg total) by mouth at bedtime.   B-12 PO Take by mouth daily.   clobetasol ointment 0.05 % Commonly known as: TEMOVATE Apply to affected area every night for 2 weeks then apply 3 x/week   Finacea 15 % gel Generic drug: Azelaic Acid APPLY ON THE SKIN DAILY   metoprolol succinate 25 MG 24 hr tablet Commonly known as: TOPROL-XL Take 0.5 tablets (12.5 mg total) by mouth 2 (two) times daily.   OXcarbazepine 150 MG tablet Commonly known as: TRILEPTAL Take 150 mg by mouth 2 (two) times daily.   Synthroid 112 MCG tablet Generic drug: levothyroxine       Allergies  Allergen Reactions   Chlorpheniramine-Phenylephrine Rash   Discharge Instructions     Ambulatory referral to  Neurology   Complete by: As directed    An appointment is requested in approximately: 1 week   Call MD for:   Complete by: As directed    Any worsening of neurological symptoms, any new neurological complaints.   Call MD for:  difficulty breathing, headache or visual disturbances   Complete by: As directed    Call MD for:  extreme fatigue   Complete by: As directed    Call MD for:  persistant dizziness or light-headedness   Complete by: As directed    Call MD for:  persistant nausea and vomiting   Complete by: As directed    Call MD for:  severe uncontrolled pain   Complete by: As directed    Call MD for:  temperature >100.4   Complete by: As directed    Diet - low sodium heart healthy   Complete by: As directed    Discharge instructions   Complete by: As directed    Follow-up with PCP in 1 week, continue monitor BP at home and follow with PCP to titrate medication accordingly. Follow-up with neurology in 1  to 2 weeks Follow-up with cardiology in 1 to 2 weeks   Increase activity slowly   Complete by: As directed        The results of significant diagnostics from this hospitalization (including imaging, microbiology, ancillary and laboratory) are listed below for reference.    Significant Diagnostic Studies: ECHOCARDIOGRAM COMPLETE Result Date: 06/13/2023    ECHOCARDIOGRAM REPORT   Patient Name:   XITLALLI LAMOUR Date of Exam: 06/13/2023 Medical Rec #:  [id number]      Height:       63.0 in Accession #:    [id number]     Weight:       191.0 lb Date of Birth:  04/27/1941     BSA:          1.896 m Patient Age:    82 years       BP:           137/71 mmHg Patient Gender: F              HR:           77 bpm. Exam Location:  [facility] Procedure: 2D Echo, Cardiac Doppler, Color Doppler and Saline Contrast Bubble            Study Indications:     TIA G45.9  History:         Patient has no prior history of Echocardiogram examinations.                  Risk Factors:Hypertension and Dyslipidemia.  Sonographer:     Cristela Blue Referring Phys:  [id number] Francoise Schaumann [place]ON Diagnosing Phys: Julien Nordmann MD IMPRESSIONS  1. Left ventricular ejection fraction, by estimation, is 60 to 65%. The left ventricle has normal function. The left ventricle has no regional wall motion abnormalities. There is moderate left ventricular hypertrophy. Left ventricular diastolic parameters are indeterminate.  2. Right ventricular systolic function is normal. The right ventricular size is normal.  3. The mitral valve is normal in structure. Mild mitral valve regurgitation. No evidence of mitral stenosis.  4. The aortic valve is normal in structure. Aortic valve regurgitation is not visualized. No aortic stenosis is present.  5. The inferior vena cava is normal in size with greater than 50% respiratory variability, suggesting right atrial pressure of 3 mmHg.  6. Rhythm is atrial flutter FINDINGS  Left  Ventricle: Left ventricular ejection fraction, by  estimation, is 60 to 65%. The left ventricle has normal function. The left ventricle has no regional wall motion abnormalities. The left ventricular internal cavity size was normal in size. There is  moderate left ventricular hypertrophy. Left ventricular diastolic parameters are indeterminate. Right Ventricle: The right ventricular size is normal. No increase in right ventricular wall thickness. Right ventricular systolic function is normal. Left Atrium: Left atrial size was normal in size. Right Atrium: Right atrial size was normal in size. Pericardium: There is no evidence of pericardial effusion. Mitral Valve: The mitral valve is normal in structure. Mild mitral valve regurgitation. No evidence of mitral valve stenosis. Tricuspid Valve: The tricuspid valve is normal in structure. Tricuspid valve regurgitation is not demonstrated. No evidence of tricuspid stenosis. Aortic Valve: The aortic valve is normal in structure. Aortic valve regurgitation is not visualized. No aortic stenosis is present. Aortic valve mean gradient measures 2.0 mmHg. Aortic valve peak gradient measures 2.8 mmHg. Aortic valve area, by VTI measures 4.08 cm. Pulmonic Valve: The pulmonic valve was normal in structure. Pulmonic valve regurgitation is not visualized. No evidence of pulmonic stenosis. Aorta: The aortic root is normal in size and structure. Venous: The inferior vena cava is normal in size with greater than 50% respiratory variability, suggesting right atrial pressure of 3 mmHg. IAS/Shunts: No atrial level shunt detected by color flow Doppler. Agitated saline contrast was given intravenously to evaluate for intracardiac shunting.  LEFT VENTRICLE PLAX 2D LVIDd:         3.90 cm LVIDs:         2.90 cm LV PW:         1.40 cm LV IVS:        1.70 cm LVOT diam:     2.00 cm LV SV:         57 LV SV Index:   30 LVOT Area:     3.14 cm  RIGHT VENTRICLE RV Basal diam:  3.30 cm RV Mid diam:    3.00 cm RV S prime:     11.10 cm/s TAPSE (M-mode):  2.1 cm LEFT ATRIUM             Index        RIGHT ATRIUM           Index LA diam:        3.50 cm 1.85 cm/m   RA Area:     12.00 cm LA Vol (A2C):   33.7 ml 17.77 ml/m  RA Volume:   24.50 ml  12.92 ml/m LA Vol (A4C):   43.4 ml 22.89 ml/m LA Biplane Vol: 37.3 ml 19.67 ml/m  AORTIC VALVE AV Area (Vmax):    3.62 cm AV Area (Vmean):   2.90 cm AV Area (VTI):     4.08 cm AV Vmax:           83.10 cm/s AV Vmean:          61.500 cm/s AV VTI:            0.141 m AV Peak Grad:      2.8 mmHg AV Mean Grad:      2.0 mmHg LVOT Vmax:         95.80 cm/s LVOT Vmean:        56.800 cm/s LVOT VTI:          0.183 m LVOT/AV VTI ratio: 1.30  AORTA Ao Root diam: 3.10 cm MITRAL VALVE  TRICUSPID VALVE MV Area (PHT): 6.07 cm    TR Peak grad:   14.7 mmHg MV Decel Time: 125 msec    TR Vmax:        192.00 cm/s MV E velocity: 93.30 cm/s                            SHUNTS                            Systemic VTI:  0.18 m                            Systemic Diam: 2.00 cm Julien Nordmann MD Electronically signed by Julien Nordmann MD Signature Date/Time: 06/13/2023/2:03:36 PM    Final    MR BRAIN WO CONTRAST Result Date: 06/12/2023 CLINICAL DATA:  Stroke follow-up for double vision. EXAM: MRI HEAD WITHOUT CONTRAST TECHNIQUE: Multiplanar, multiecho pulse sequences of the brain and surrounding structures were obtained without intravenous contrast. COMPARISON:  None Available. FINDINGS: Brain: Restricted diffusion involving the right occipital parietal cortex. A moderate area is affected. Chronic small vessel ischemia in the cerebral white matter and pons which is mild for age. Small chronic left cerebellar infarct. No hemorrhage, hydrocephalus, mass, or collection. Vascular: Major flow voids are preserved Skull and upper cervical spine: Degenerative facet spurring with low T1 signal at the right C3-4 facet, usually degenerative marrow edema. Sinuses/Orbits: Negative IMPRESSION: Moderate size acute infarct in the right occipital  parietal cortex. Electronically Signed   By: Tiburcio Pea M.D.   On: 06/12/2023 04:04   CT ANGIO HEAD NECK W WO CM Result Date: 06/12/2023 CLINICAL DATA:  Diplopia EXAM: CT ANGIOGRAPHY HEAD AND NECK WITH AND WITHOUT CONTRAST TECHNIQUE: Multidetector CT imaging of the head and neck was performed using the standard protocol during bolus administration of intravenous contrast. Multiplanar CT image reconstructions and MIPs were obtained to evaluate the vascular anatomy. Carotid stenosis measurements (when applicable) are obtained utilizing NASCET criteria, using the distal internal carotid diameter as the denominator. RADIATION DOSE REDUCTION: This exam was performed according to the departmental dose-optimization program which includes automated exposure control, adjustment of the mA and/or kV according to patient size and/or use of iterative reconstruction technique. CONTRAST:  75mL OMNIPAQUE IOHEXOL 350 MG/ML SOLN COMPARISON:  None Available. FINDINGS: CTA NECK FINDINGS Skeleton: No acute abnormality or high grade bony spinal canal stenosis. Other neck: Normal pharynx, larynx and major salivary glands. No cervical lymphadenopathy. Unremarkable thyroid gland. Upper chest: No pneumothorax or pleural effusion. No nodules or masses. Aortic arch: There is no calcific atherosclerosis of the aortic arch. Conventional 3 vessel aortic branching pattern. RIGHT carotid system: Normal without aneurysm, dissection or stenosis. LEFT carotid system: Normal without aneurysm, dissection or stenosis. Vertebral arteries: Left dominant configuration. There is no dissection, occlusion or flow-limiting stenosis to the skull base (V1-V3 segments). CTA HEAD FINDINGS POSTERIOR CIRCULATION: Mild atherosclerotic calcification of both V4 segments without hemodynamically significant stenosis. No proximal occlusion of the anterior or inferior cerebellar arteries. Basilar artery is normal. Superior cerebellar arteries are normal. The right  PCA is normal. There is a partial fetal origin of the left PCA. The normal P1 segment is severely stenotic or occluded at the left P1 2 junction. There is also severe stenosis of the distal left P-comm. Distal left PCA is normal. ANTERIOR CIRCULATION: Atherosclerotic calcification of the internal carotid arteries at  the skull base without hemodynamically significant stenosis. Anterior cerebral arteries are normal. Middle cerebral arteries are normal. Venous sinuses: As permitted by contrast timing, patent. Anatomic variants: Partial fetal origin of the left posterior cerebral artery. Review of the MIP images confirms the above findings. IMPRESSION: 1. 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 2. No hemodynamically significant stenosis of the neck. Electronically Signed   By: Deatra Robinson M.D.   On: 06/12/2023 02:12   CT Head Wo Contrast Result Date: 06/11/2023 CLINICAL DATA:  Headache, neuro deficit EXAM: CT HEAD WITHOUT CONTRAST TECHNIQUE: Contiguous axial images were obtained from the base of the skull through the vertex without intravenous contrast. RADIATION DOSE REDUCTION: This exam was performed according to the departmental dose-optimization program which includes automated exposure control, adjustment of the mA and/or kV according to patient size and/or use of iterative reconstruction technique. COMPARISON:  Head CT 12/11/21 FINDINGS: Brain: No hemorrhage. No hydrocephalus. No extra-axial fluid collection. No CT evidence of an acute cortical infarct mass effect. No mass lesion. There is a background of mild chronic microvascular ischemic change. Vascular: Possible hyperdense right MCA Skull: Normal. Negative for fracture or focal lesion. Sinuses/Orbits: No middle ear or mastoid effusion. Paranasal sinuses are clear. Orbits are unremarkable. Other: None. IMPRESSION: 1.  No hemorrhage or CT  evidence of an acute cortical infarct. 2. Possible hyperdense right MCA. Recommend CTA head/neck for further evaluation. Electronically Signed   By: Lorenza Cambridge M.D.   On: 06/11/2023 16:01    Microbiology: No results found for this or any previous visit (from the past 240 hours).   Labs: CBC: Recent Labs  Lab 06/11/23 1409  WBC 9.3  NEUTROABS 6.3  HGB 14.7  HCT 44.6  MCV 94.3  PLT 253   Basic Metabolic Panel: Recent Labs  Lab 06/11/23 1409 06/12/23 1449 06/13/23 0521  NA 130*  --  133*  K 3.9  --  4.4  CL 96*  --  99  CO2 25  --  23  GLUCOSE 149*  --  118*  BUN 24*  --  19  CREATININE 0.90  --  0.78  CALCIUM 9.0  --  9.7  MG  --  1.4* 1.7  PHOS  --   --  3.5   Liver Function Tests: No results for input(s): "AST", "ALT", "ALKPHOS", "BILITOT", "PROT", "ALBUMIN" in the last 168 hours. No results for input(s): "LIPASE", "AMYLASE" in the last 168 hours. No results for input(s): "AMMONIA" in the last 168 hours. Cardiac Enzymes: No results for input(s): "CKTOTAL", "CKMB", "CKMBINDEX", "TROPONINI" in the last 168 hours. BNP (last 3 results) No results for input(s): "BNP" in the last 8760 hours. CBG: No results for input(s): "GLUCAP" in the last 168 hours.  Time spent: 35 minutes  Signed:  Gillis Santa  [facility] 06/13/2023 2:33 PM

## 2023-06-13 NOTE — ED Notes (Signed)
Pt ambulated to bathroom w a steady gate. Pt returned to bed without incident and hooked back to monitors. Call light within reach.

## 2023-06-13 NOTE — Progress Notes (Signed)
*  PRELIMINARY RESULTS* Echocardiogram 2D Echocardiogram has been performed.  Pamela Garner 06/13/2023, 8:52 AM

## 2023-06-13 NOTE — ED Notes (Signed)
Cardiology team at bedside

## 2023-06-13 NOTE — ED Notes (Signed)
Pt ambulated to bedside commode with steady gate. This RN provided full linen change and warm blankets given. Husband and daughter at bedside. Pt reconnected to monitor. NAD, call light within reach

## 2023-06-15 NOTE — Plan of Care (Incomplete)
  Call patient to stop taking  OXcarbazepine 150 MG tablet Commonly known as: TRILEPTAL Take 150 mg by mouth 2 (two) times daily.  Also ask the indication of above medication, why she was taking it?

## 2023-06-28 NOTE — Progress Notes (Signed)
 Cardiology Clinic Note   Date: 06/29/2023 ID: [name], DOB Jan 19, 1941, MRN [id number]  Primary Cardiologist:  Lonni Hanson, MD  Patient Profile    Pamela Garner is a 83 y.o. female who presents to the clinic today for hospital follow up.     Past medical history significant for: A-flutter. 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  Heart block. Hypertension. Hyperlipidemia. Lipid panel 06/13/2023: LDL 110, HDL 52, TG 108, total 184.  GERD. CVA. MRI brain 06/12/2023: Moderate size acute infarct in right occipital parietal cortex.  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 No hemodynamically significant stenosis of the neck.  Echo 06/13/2023: EF 60 to 65%.  No RWMA.  Moderate LVH.  Indeterminate diastolic parameters.  Normal RV size/function.  Mild MR.  In summary, patient with remote history of seeing a cardiologist for skipped beats due to increased caffeine intake. No recurrence since decreasing caffeine. Patient presented to the ED on 06/11/2023 with complaints of headaches, dizziness, diplopia for 3 days. BP upon arrival 157/75. Initial labs: sodium 130, potassium 3.9, CRT 0.9, BUN 24, WBC 9.3, hemoglobin 14.7, TSH 1.442. Troponin negative. EKG showed aflutter, 72 bpm, RBBB and variable heart block.  CTA head and neck and MRI brain as above. Cardiology was consulted and patient was started on Eliquis . AV nodal blocking agent avoided secondary to variable block. 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 Echo showed normal LV/RV function as detailed above. Patient was discharged with orders for 2 week Zio and close follow up.      History of Present Illness    Pamela Garner is followed by Dr. Hanson for the above outlined history.   Today, patient is accompanied by her husband and daughter. She reports doing well since  discharge from hospital. She mailed the Zio monitor back yesterday. Patient denies shortness of breath, dyspnea on exertion, lower extremity edema, orthopnea or PND. No chest pain, pressure, or tightness. No palpitations.  She has resumed her normal activities that include cooking, cleaning, and performing household chores. She states I never sit down but she has been taking rest breaks since discharge from hospital. She denies blood in stool or urine. She mentions that she was supposed to have knee replacement next month. Would like her to hold off until she sees EP. She is in agreement.     ROS: All other systems reviewed and are otherwise negative except as noted in History of Present Illness.  EKGs/Labs Reviewed    EKG Interpretation Date/Time:  Friday June 29 2023 15:32:16 EST Ventricular Rate:  61 PR Interval:  340 QRS Duration:  128 QT Interval:  438 QTC Calculation: 440 R Axis:   111  Text Interpretation: Sinus rhythm with 1st degree A-V block Right axis deviation Non-specific intra-ventricular conduction block RBBB When compared with ECG of 06/13/2023 No significant change was found Confirmed by Loistine Sober [phone]) on 06/29/2023 3:42:45 PM   06/13/2023: BUN 19; Creatinine, Ser 0.78; Potassium 4.4; Sodium 133   06/11/2023: Hemoglobin 14.7; WBC 9.3   06/12/2023: TSH 1.442    Risk Assessment/Calculations     CHA2DS2-VASc Score = 6   This indicates a 9.7% annual risk of stroke. The patient's score is based upon: CHF History: 0 HTN History: 1 Diabetes History: 0 Stroke History: 2 Vascular Disease History: 0 Age Score: 2 Gender Score: 1  Physical Exam    VS:  BP 139/78 (BP Location: Left Arm, Patient Position: Sitting, Cuff Size: Normal)   Pulse 61   Ht 5' 3 (1.6 m)   Wt 186 lb 12.8 oz (84.7 kg)   SpO2 98%   BMI 33.09 kg/m  , BMI Body mass index is 33.09 kg/m.  GEN: Well nourished, well developed, in no acute distress. Neck: No JVD or  carotid bruits. Cardiac:  RRR. No murmurs. No rubs or gallops.   Respiratory:  Respirations regular and unlabored. Clear to auscultation without rales, wheezing or rhonchi. GI: Soft, nontender, nondistended. Extremities: Radials/DP/PT 2+ and equal bilaterally. No clubbing or cyanosis. No edema.  Skin: Warm and dry, no rash. Neuro: Strength intact.  Assessment & Plan   A-flutter Onset December 2024 in the setting of stroke. Patient denies palpitations. EKG SR with 1st degree AV block. Denies spontaneous bleeding concerns.  -Continue Toprol  and Eliquis . Appropriate Eliquis  dose.    Hypertension BP today 139/78. No report of headaches or dizziness.  -Continue Toprol .   Hyperlipidemia LDL December 2024 110, not at goal.  -Continue atorvastatin .   Disposition: Refer to EP for aflutter.          Signed, Barnie HERO. Darreon Lutes, DNP, NP-C

## 2023-06-29 ENCOUNTER — Encounter: Payer: Self-pay | Admitting: Student

## 2023-06-29 ENCOUNTER — Ambulatory Visit: Payer: Medicare Other | Attending: Student | Admitting: Student

## 2023-06-29 VITALS — BP 139/78 | HR 61 | Ht 63.0 in | Wt 186.8 lb

## 2023-06-29 DIAGNOSIS — E7800 Pure hypercholesterolemia, unspecified: Secondary | ICD-10-CM

## 2023-06-29 DIAGNOSIS — I4892 Unspecified atrial flutter: Secondary | ICD-10-CM | POA: Diagnosis not present

## 2023-06-29 DIAGNOSIS — I10 Essential (primary) hypertension: Secondary | ICD-10-CM | POA: Diagnosis not present

## 2023-06-29 NOTE — Patient Instructions (Signed)
 Medication Instructions:  Your Physician recommend you continue on your current medication as directed.    *If you need a refill on your cardiac medications before your next appointment, please call your pharmacy*   Lab Work: None ordered at this time  If you have labs (blood work) drawn today and your tests are completely normal, you will receive your results only by: MyChart Message (if you have MyChart) OR A paper copy in the mail If you have any lab test that is abnormal or we need to change your treatment, we will call you to review the results.   Testing/Procedures: None ordered at this time    Follow-Up: At [facility], you and your health needs are our priority.  As part of our continuing mission to provide you with exceptional heart care, we have created designated Provider Care Teams.  These Care Teams include your primary Cardiologist (physician) and Advanced Practice Providers (APPs -  Physician Assistants and Nurse Practitioners) who all work together to provide you with the care you need, when you need it.   Your next appointment:   As needed from electrophysiology

## 2023-07-12 NOTE — Addendum Note (Signed)
Encounter addended by: Jefferey Pica, RN on: 07/12/2023 1:39 PM  Actions taken: Imaging Exam ended

## 2023-07-17 ENCOUNTER — Encounter: Payer: Self-pay | Admitting: Cardiology

## 2023-07-17 ENCOUNTER — Ambulatory Visit: Payer: Medicare Other | Admitting: Cardiology

## 2023-07-17 ENCOUNTER — Ambulatory Visit: Payer: Medicare Other | Attending: Cardiology | Admitting: Cardiology

## 2023-07-17 VITALS — BP 145/77 | HR 63 | Ht 63.0 in | Wt 187.6 lb

## 2023-07-17 DIAGNOSIS — I10 Essential (primary) hypertension: Secondary | ICD-10-CM | POA: Diagnosis not present

## 2023-07-17 DIAGNOSIS — I480 Paroxysmal atrial fibrillation: Secondary | ICD-10-CM

## 2023-07-17 DIAGNOSIS — Z8673 Personal history of transient ischemic attack (TIA), and cerebral infarction without residual deficits: Secondary | ICD-10-CM | POA: Diagnosis not present

## 2023-07-17 DIAGNOSIS — I4892 Unspecified atrial flutter: Secondary | ICD-10-CM

## 2023-07-17 DIAGNOSIS — I483 Typical atrial flutter: Secondary | ICD-10-CM | POA: Diagnosis not present

## 2023-07-17 DIAGNOSIS — D6869 Other thrombophilia: Secondary | ICD-10-CM

## 2023-07-17 NOTE — Patient Instructions (Addendum)
Medication Instructions:  Your physician recommends that you continue on your current medications as directed. Please refer to the Current Medication list given to you today.  *If you need a refill on your cardiac medications before your next appointment, please call your pharmacy*  Follow-Up: At [facility], you and your health needs are our priority.  As part of our continuing mission to provide you with exceptional heart care, we have created designated Provider Care Teams.  These Care Teams include your primary Cardiologist (physician) and Advanced Practice Providers (APPs -  Physician Assistants and Nurse Practitioners) who all work together to provide you with the care you need, when you need it.   Your next appointment:   Please call us when you decide whether you would like to proceed with Tikosyn or and Ablation

## 2023-07-17 NOTE — Progress Notes (Unsigned)
Electrophysiology Office Note:   Date:  07/18/2023  ID:  Pamela Garner, DOB 1940/10/10, MRN [id number]  Primary Cardiologist: Yvonne Kendall, MD Primary Heart Failure: None Electrophysiologist: Nobie Putnam, MD      History of Present Illness:   Pamela Garner is a 83 y.o. female with h/o hypertension, hyperlipidemia, thyroid disease who is being seen today for atrial fibrillation.   Patient presented to ED on 11/17 with double vision, lasting 4-5 days. She was found to have moderate-sized acute infarct in the right occipital parietal cortex on MRI. She was diagnosed with new onset atrial fibrillation in this setting. She was started on metoprolol and Eliquis. She wore a Zio monitor after discharge and was found to have AF burden of 5%. She appears to be asymptomatic and cannot tell when she is out of rhythm. She has otherwise recovered relatively well from her stroke. She has no new or acute complaints today. She has been taking Eliquis and is tolerating without issues.  Review of systems complete and found to be negative unless listed in HPI.   EP Information / Studies Reviewed:    EKG is ordered today. Personal review as below.  EKG Interpretation Date/Time:  Tuesday July 17 2023 14:10:26 EST Ventricular Rate:  63 PR Interval:  320 QRS Duration:  132 QT Interval:  430 QTC Calculation: 440 R Axis:   158  Text Interpretation: Sinus rhythm with 1st degree A-V block Right axis deviation Non-specific intra-ventricular conduction block Abnormal QRS-T angle, consider primary T wave abnormality When compared with ECG of 29-Jun-2023 15:32, No significant change was found Confirmed by Nobie Putnam [phone]) on 07/18/2023 1:32:33 PM   EKG 06/12/23: AFL   Zio 07/12/23:   The patient was monitored for 13 days, 23 hours.  10 days, 2 hours were suitable for analysis after removal of artifact.   The predominant rhythm was sinus with an average rate of 64 bpm (range 45-99 bpm in sinus).    There were rare PACs and PVCs.   Paroxysmal atrial fibrillation was observed, with an average ventricular rate of 67 bpm (range 42-101 bpm in atrial fibrillation).  Longest episode lasted 4 hours, 25 minutes.  Atrial fibrillation burden was 5%.   No prolonged pause was seen.   Single patient triggered event corresponded to normal sinus rhythm.   Echo 06/13/23:  1. Left ventricular ejection fraction, by estimation, is 60 to 65%. The  left ventricle has normal function. The left ventricle has no regional  wall motion abnormalities. There is moderate left ventricular hypertrophy.  Left ventricular diastolic  parameters are indeterminate.   2. Right ventricular systolic function is normal. The right ventricular  size is normal.   3. The mitral valve is normal in structure. Mild mitral valve  regurgitation. No evidence of mitral stenosis.   4. The aortic valve is normal in structure. Aortic valve regurgitation is  not visualized. No aortic stenosis is present.   5. The inferior vena cava is normal in size with greater than 50%  respiratory variability, suggesting right atrial pressure of 3 mmHg.   6. Rhythm is atrial flutter   Risk Assessment/Calculations:    CHA2DS2-VASc Score = 6   This indicates a 9.7% annual risk of stroke. The patient's score is based upon: CHF History: 0 HTN History: 1 Diabetes History: 0 Stroke History: 2 Vascular Disease History: 0 Age Score: 2 Gender Score: 1         Physical Exam:   VS:  BP (!) 145/77 (BP Location:  Left Arm, Patient Position: Sitting, Cuff Size: Normal)   Pulse 63   Ht 5\' 3"  (1.6 m)   Wt 187 lb 9.6 oz (85.1 kg)   SpO2 96%   BMI 33.23 kg/m    Wt Readings from Last 3 Encounters:  07/17/23 187 lb 9.6 oz (85.1 kg)  06/29/23 186 lb 12.8 oz (84.7 kg)  06/12/23 191 lb (86.6 kg)     GEN: Well nourished, well developed in no acute distress NECK: No JVD CARDIAC: Normal rate, regular rhythm RESPIRATORY:  Clear to auscultation without  rales, wheezing or rhonchi  ABDOMEN: Soft, non-tender, non-distended EXTREMITIES:  No edema; No deformity   ASSESSMENT AND PLAN:   Pamela Garner is a 83 y.o. female with h/o hypertension, hyperlipidemia, thyroid disease who is being seen today for atrial fibrillation.   Patient presented to ED in November 2024 with an acute stroke. Found to have new typical atrial flutter in this setting. She wore a cardiac monitor on discharge which showed a 5% burden of atrial fibrillation.  #Typical atrial flutter: #Paroxysmal atrial fibrillation: - Given her stroke was presumptively secondary to atrial fibrillation/flutter, I think that maintaining sinus rhythm would be of value. She is currently tolerating Eliquis as stroke prophylaxis. 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 Tikosyn could be an option for her. We discussed antiarrhythmic drug therapy with Tikosyn vs catheter ablation. Discussed risks, recovery and likelihood of success with each treatment strategy. Risk, benefits, and alternatives to EP study and ablation for afib were discussed. The patient would like to think about her options.  - Continue low dose metoprolol. Avoid further uptitration due to long first degree AV block and baseline conduction disease.  - Continue Eliquis.  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  Recommend checking blood pressures 1-2 times per week at home and recording the values.  Recommend bringing these recordings to the primary care physician.  Follow up with Dr. Jimmey Ralph in 3 months  Signed, Nobie Putnam, MD

## 2023-07-23 ENCOUNTER — Telehealth: Payer: Self-pay | Admitting: Cardiology

## 2023-07-23 DIAGNOSIS — I483 Typical atrial flutter: Secondary | ICD-10-CM

## 2023-07-23 DIAGNOSIS — I480 Paroxysmal atrial fibrillation: Secondary | ICD-10-CM

## 2023-07-23 NOTE — Telephone Encounter (Signed)
Spoke with the patient and scheduled her for an ablation with Dr. Jimmey Ralph on 3/25.

## 2023-07-23 NOTE — Telephone Encounter (Signed)
Patient would like to discuss scheduling an ablation with Dr. Jimmey Ralph.

## 2023-07-24 ENCOUNTER — Other Ambulatory Visit: Payer: Self-pay | Admitting: Internal Medicine

## 2023-07-24 DIAGNOSIS — R748 Abnormal levels of other serum enzymes: Secondary | ICD-10-CM

## 2023-07-25 ENCOUNTER — Ambulatory Visit
Admission: RE | Admit: 2023-07-25 | Discharge: 2023-07-25 | Disposition: A | Discharge status: Home / Self Care | Payer: Medicare Other | Source: Ambulatory Visit | Attending: Internal Medicine | Admitting: Internal Medicine

## 2023-07-25 DIAGNOSIS — R748 Abnormal levels of other serum enzymes: Secondary | ICD-10-CM | POA: Insufficient documentation

## 2023-08-23 ENCOUNTER — Telehealth: Payer: Self-pay

## 2023-08-23 ENCOUNTER — Other Ambulatory Visit
Admission: RE | Admit: 2023-08-23 | Discharge: 2023-08-23 | Disposition: A | Discharge status: Home / Self Care | Payer: Medicare Other | Source: Ambulatory Visit | Attending: Cardiology | Admitting: Cardiology

## 2023-08-23 DIAGNOSIS — I483 Typical atrial flutter: Secondary | ICD-10-CM | POA: Insufficient documentation

## 2023-08-23 DIAGNOSIS — I480 Paroxysmal atrial fibrillation: Secondary | ICD-10-CM | POA: Diagnosis present

## 2023-08-23 LAB — CBC
HCT: 39 % (ref 36.0–46.0)
Hemoglobin: 13.6 g/dL (ref 12.0–15.0)
MCH: 31.2 pg (ref 26.0–34.0)
MCHC: 34.9 g/dL (ref 30.0–36.0)
MCV: 89.4 fL (ref 80.0–100.0)
Platelets: 232 10*3/uL (ref 150–400)
RBC: 4.36 MIL/uL (ref 3.87–5.11)
RDW: 12.5 % (ref 11.5–15.5)
WBC: 8.5 10*3/uL (ref 4.0–10.5)
nRBC: 0 % (ref 0.0–0.2)

## 2023-08-23 LAB — BASIC METABOLIC PANEL
Anion gap: 12 (ref 5–15)
BUN: 19 mg/dL (ref 8–23)
CO2: 24 mmol/L (ref 22–32)
Calcium: 9.2 mg/dL (ref 8.9–10.3)
Chloride: 87 mmol/L — ABNORMAL LOW (ref 98–111)
Creatinine, Ser: 0.68 mg/dL (ref 0.44–1.00)
GFR, Estimated: >60 mL/min (ref >60)
Glucose, Bld: 100 mg/dL — ABNORMAL HIGH (ref 70–99)
Potassium: 4.2 mmol/L (ref 3.5–5.1)
Sodium: 123 mmol/L — ABNORMAL LOW (ref 135–145)

## 2023-08-23 NOTE — Telephone Encounter (Signed)
 Spoke with patient to complete one month pre-procedure call.     Patient was seen by podiatry on 08/15/23- diagnosed with Paronychia of right great toe- had I&D performed with cultures taken and given 7 day Rx- Augmentin for infection. She took the last dose today. She reports toe is "drying up". She denies any drainage, redness, swelling, or fever. She will follow up with podiatry on 08/31/23. I will follow up with patient after procedure to reassess. Will also make Dr. Jimmey Ralph aware and for any additional recs?   Recent hospitalizations or surgeries? No Patient made aware to contact office to inform of any new medications started. Any changes in activities of daily living? No  Pre-procedure testing scheduled: CT on 08/27/23 and lab work on 08/23/23.  Confirmed patient is taking Eliquis and will continue taking medication before procedure or it may need to be rescheduled.  Confirmed patient is scheduled for Atrial Fibrillation Ablation  on Tuesday, March 25 with Dr. Michele Rockers. Instructed patient to arrive at the Main Entrance A at [facility]: [address] [place], [place] [address] and check in at Admitting at 8:00 AM.  Advised of plan to go home the same day and will only stay overnight if medically necessary. You MUST have a responsible adult to drive you home and MUST be with you the first 24 hours after you arrive home or your procedure could be cancelled.  Patient verbalized understanding to information provided and is agreeable to proceed with procedure.

## 2023-08-23 NOTE — Addendum Note (Signed)
 Addended by: Yehuda Savannah on: 08/23/2023 12:47 PM   Modules accepted: Orders

## 2023-08-23 NOTE — Telephone Encounter (Signed)
 Per Dr. Jimmey Ralph, If podiatry on 3/7 wants to do any more work then we will have to delay ablation.

## 2023-08-24 ENCOUNTER — Telehealth: Payer: Self-pay

## 2023-08-24 NOTE — Telephone Encounter (Signed)
 Pt's AF Ablation with Dr. Jimmey Ralph on 3/25 has been moved to 3/27 due to a schedule change. She is aware.

## 2023-08-27 ENCOUNTER — Ambulatory Visit
Admission: RE | Admit: 2023-08-27 | Discharge: 2023-08-27 | Disposition: A | Discharge status: Home / Self Care | Payer: Medicare Other | Source: Ambulatory Visit | Attending: Cardiology | Admitting: Cardiology

## 2023-08-27 DIAGNOSIS — I483 Typical atrial flutter: Secondary | ICD-10-CM | POA: Diagnosis present

## 2023-08-27 DIAGNOSIS — I480 Paroxysmal atrial fibrillation: Secondary | ICD-10-CM

## 2023-08-27 MED ORDER — SODIUM CHLORIDE 0.9 % IV SOLN: INTRAVENOUS | Status: DC

## 2023-08-27 MED ORDER — IOHEXOL 350 MG/ML SOLN
100.0000 mL | Freq: Once | INTRAVENOUS | Status: AC | PRN
  Administered 2023-08-27: 100 mL via INTRAVENOUS

## 2023-09-13 ENCOUNTER — Telehealth (HOSPITAL_COMMUNITY): Payer: Self-pay

## 2023-09-13 NOTE — Telephone Encounter (Signed)
 Attempted to reach patient to discuss upcoming procedure, no answer. Left VM for patient to return call.

## 2023-09-13 NOTE — Telephone Encounter (Signed)
 Left a message for the patient to call back.

## 2023-09-13 NOTE — Telephone Encounter (Signed)
 Pt is requesting a callback regarding her needing a reminder of what medications to take or not take before her ablation and the location again. Please advise

## 2023-09-14 NOTE — Telephone Encounter (Signed)
 Pt returned my call and I went over details of her procedure. She had no further questions.

## 2023-09-14 NOTE — Telephone Encounter (Signed)
 LM for pt to call back. I left my direct number on her VM with the date/time she needs to arrive and where to go the date of her procedure. Also included that she should not take any medications the day of her procedure and she does not need to hold any medications prior to that day.

## 2023-09-17 NOTE — Telephone Encounter (Signed)
 Call placed to patient to discuss upcoming procedure.   CT: completed.  Labs: completed.   Any recent signs of acute illness or been started on antibiotics? No Any new medications started? NO Any medications to hold? No Any missed doses of blood thinner? Patient reports she may have accidentally taken the AM/PM dose of Eliquis at the same time due to some confusion with her pill box. She denies any concerns with bleeding.  Advised patient to continue taking ANTICOAGULANT: Eliquis (Apixaban) without missing any doses.  Medication instructions:  On the morning of your procedure DO NOT take any medication., including Eliquis or the procedure may be rescheduled. Nothing to eat or drink after midnight prior to your procedure.  Confirmed patient is scheduled for Atrial Fibrillation Ablation on Thursday, March 27 with Dr. Michele Rockers. Instructed patient to arrive at the Main Entrance A at [facility]: [address] [place], [place] [address] and check in at Admitting at 5:30 AM.  Advised of plan to go home the same day and will only stay overnight if medically necessary. You MUST have a responsible adult to drive you home and MUST be with you the first 24 hours after you arrive home or your procedure could be cancelled.  Patient verbalized understanding to all instructions provided and agreed to proceed with procedure. She plans to stop by office to pick up a copy of instructions if she doesn't receive in mail today as requested.

## 2023-09-19 NOTE — Pre-Procedure Instructions (Signed)
 Instructed patient on the following items: Arrival time 0515 Nothing to eat or drink after midnight No meds AM of procedure Responsible person to drive you home and stay with you for 24 hrs  Have you missed any doses of anti-coagulant Eliquis- takes twice a day, hasn't missed any doses.  Don't take dose morning of procedure.

## 2023-09-19 NOTE — Anesthesia Preprocedure Evaluation (Signed)
 Anesthesia Evaluation  Patient identified by MRN, date of birth, ID band Patient awake    Reviewed: Allergy & Precautions, NPO status , Patient's Chart, lab work & pertinent test results, reviewed documented beta blocker date and time   History of Anesthesia Complications Negative for: history of anesthetic complications  Airway Mallampati: III  TM Distance: >3 FB Neck ROM: Full    Dental  (+) Dental Advisory Given   Pulmonary neg pulmonary ROS   Pulmonary exam normal        Cardiovascular hypertension, Pt. on medications and Pt. on home beta blockers Normal cardiovascular exam+ dysrhythmias Atrial Fibrillation    '24 TTE - EF 60 to 65%. There is moderate left ventricular hypertrophy. Mild MR.     Neuro/Psych  Neuromuscular disease CVA, No Residual Symptoms  negative psych ROS   GI/Hepatic Neg liver ROS,GERD  ,,  Endo/Other   Obesity Subacute hyponatremia - likely secondary to new medications started within past several months   Renal/GU negative Renal ROS     Musculoskeletal  (+) Arthritis ,    Abdominal   Peds  Hematology  On eliquis    Anesthesia Other Findings   Reproductive/Obstetrics                             Anesthesia Physical Anesthesia Plan  ASA: 3  Anesthesia Plan: General   Post-op Pain Management: Tylenol PO (pre-op)*   Induction: Intravenous  PONV Risk Score and Plan: 3 and Treatment may vary due to age or medical condition, Ondansetron and Propofol infusion  Airway Management Planned: Oral ETT  Additional Equipment: None  Intra-op Plan:   Post-operative Plan: Extubation in OR  Informed Consent: I have reviewed the patients History and Physical, chart, labs and discussed the procedure including the risks, benefits and alternatives for the proposed anesthesia with the patient or authorized representative who has indicated his/her understanding and  acceptance.     Dental advisory given  Plan Discussed with: CRNA and Anesthesiologist  Anesthesia Plan Comments:        Anesthesia Quick Evaluation

## 2023-09-20 ENCOUNTER — Ambulatory Visit (HOSPITAL_COMMUNITY)
Admission: RE | Admit: 2023-09-20 | Discharge: 2023-09-20 | Disposition: A | Discharge status: Home / Self Care | Payer: Medicare Other | Attending: Cardiology | Admitting: Cardiology

## 2023-09-20 ENCOUNTER — Ambulatory Visit (HOSPITAL_COMMUNITY): Admitting: Anesthesiology

## 2023-09-20 ENCOUNTER — Ambulatory Visit (HOSPITAL_BASED_OUTPATIENT_CLINIC_OR_DEPARTMENT_OTHER): Admitting: Anesthesiology

## 2023-09-20 ENCOUNTER — Encounter (HOSPITAL_COMMUNITY)
Admission: RE | Disposition: A | Discharge status: Home / Self Care | Payer: Medicare Other | Source: Home / Self Care | Attending: Cardiology

## 2023-09-20 ENCOUNTER — Encounter (HOSPITAL_COMMUNITY): Payer: Self-pay | Admitting: Cardiology

## 2023-09-20 ENCOUNTER — Other Ambulatory Visit: Payer: Self-pay

## 2023-09-20 DIAGNOSIS — I480 Paroxysmal atrial fibrillation: Secondary | ICD-10-CM

## 2023-09-20 DIAGNOSIS — I679 Cerebrovascular disease, unspecified: Secondary | ICD-10-CM | POA: Diagnosis not present

## 2023-09-20 DIAGNOSIS — I483 Typical atrial flutter: Secondary | ICD-10-CM | POA: Diagnosis not present

## 2023-09-20 DIAGNOSIS — Z7901 Long term (current) use of anticoagulants: Secondary | ICD-10-CM | POA: Diagnosis not present

## 2023-09-20 DIAGNOSIS — M1990 Unspecified osteoarthritis, unspecified site: Secondary | ICD-10-CM | POA: Insufficient documentation

## 2023-09-20 DIAGNOSIS — Z8673 Personal history of transient ischemic attack (TIA), and cerebral infarction without residual deficits: Secondary | ICD-10-CM | POA: Insufficient documentation

## 2023-09-20 DIAGNOSIS — I10 Essential (primary) hypertension: Secondary | ICD-10-CM | POA: Insufficient documentation

## 2023-09-20 DIAGNOSIS — D6869 Other thrombophilia: Secondary | ICD-10-CM | POA: Diagnosis not present

## 2023-09-20 DIAGNOSIS — I440 Atrioventricular block, first degree: Secondary | ICD-10-CM | POA: Diagnosis not present

## 2023-09-20 DIAGNOSIS — Z01818 Encounter for other preprocedural examination: Secondary | ICD-10-CM

## 2023-09-20 LAB — BASIC METABOLIC PANEL WITH GFR
Anion gap: 12 (ref 5–15)
BUN: 19 mg/dL (ref 8–23)
CO2: 22 mmol/L (ref 22–32)
Calcium: 9.6 mg/dL (ref 8.9–10.3)
Chloride: 90 mmol/L — ABNORMAL LOW (ref 98–111)
Creatinine, Ser: 0.77 mg/dL (ref 0.44–1.00)
GFR, Estimated: >60 mL/min (ref >60)
Glucose, Bld: 102 mg/dL — ABNORMAL HIGH (ref 70–99)
Potassium: 4 mmol/L (ref 3.5–5.1)
Sodium: 124 mmol/L — ABNORMAL LOW (ref 135–145)

## 2023-09-20 LAB — POCT ACTIVATED CLOTTING TIME: Activated Clotting Time: 285 s

## 2023-09-20 SURGERY — ATRIAL FIBRILLATION ABLATION
Anesthesia: General

## 2023-09-20 MED ORDER — HEPARIN SODIUM (PORCINE) 1000 UNIT/ML IJ SOLN
INTRAMUSCULAR | Status: DC | PRN
Start: 2023-09-20 — End: 2023-09-20
  Administered 2023-09-20: 3000 [IU] via INTRAVENOUS
  Administered 2023-09-20: 12000 [IU] via INTRAVENOUS

## 2023-09-20 MED ORDER — PHENYLEPHRINE 80 MCG/ML (10ML) SYRINGE FOR IV PUSH (FOR BLOOD PRESSURE SUPPORT)
PREFILLED_SYRINGE | INTRAVENOUS | Status: DC | PRN
  Administered 2023-09-20 (×2): 80 ug via INTRAVENOUS
  Administered 2023-09-20: 120 ug via INTRAVENOUS

## 2023-09-20 MED ORDER — NITROGLYCERIN 1 MG/10 ML FOR IR/CATH LAB
INTRA_ARTERIAL | Status: AC
  Filled 2023-09-20: qty 20

## 2023-09-20 MED ORDER — FENTANYL CITRATE (PF) 250 MCG/5ML IJ SOLN
INTRAMUSCULAR | Status: DC | PRN
  Administered 2023-09-20 (×2): 50 ug via INTRAVENOUS

## 2023-09-20 MED ORDER — ONDANSETRON HCL 4 MG/2ML IJ SOLN
4.0000 mg | Freq: Four times a day (QID) | INTRAMUSCULAR | Status: DC | PRN
Start: 2023-09-20 — End: 2023-09-20

## 2023-09-20 MED ORDER — HEPARIN (PORCINE) IN NACL 1000-0.9 UT/500ML-% IV SOLN
INTRAVENOUS | Status: DC | PRN
  Administered 2023-09-20 (×3): 500 mL

## 2023-09-20 MED ORDER — SUGAMMADEX SODIUM 200 MG/2ML IV SOLN
INTRAVENOUS | Status: DC | PRN
  Administered 2023-09-20: 200 mg via INTRAVENOUS

## 2023-09-20 MED ORDER — ROCURONIUM BROMIDE 10 MG/ML (PF) SYRINGE
PREFILLED_SYRINGE | INTRAVENOUS | Status: DC | PRN
  Administered 2023-09-20: 50 mg via INTRAVENOUS
  Administered 2023-09-20: 30 mg via INTRAVENOUS

## 2023-09-20 MED ORDER — PROTAMINE SULFATE 10 MG/ML IV SOLN
INTRAVENOUS | Status: DC | PRN
  Administered 2023-09-20: 35 mg via INTRAVENOUS

## 2023-09-20 MED ORDER — PROPOFOL 1000 MG/100ML IV EMUL
INTRAVENOUS | Status: AC
  Filled 2023-09-20: qty 100

## 2023-09-20 MED ORDER — SODIUM CHLORIDE 0.9% FLUSH: 3.0000 mL | INTRAVENOUS | Status: DC | PRN

## 2023-09-20 MED ORDER — FENTANYL CITRATE (PF) 100 MCG/2ML IJ SOLN
INTRAMUSCULAR | Status: AC
  Filled 2023-09-20: qty 2

## 2023-09-20 MED ORDER — ATROPINE SULFATE 1 MG/ML IV SOLN
INTRAVENOUS | Status: DC | PRN
  Administered 2023-09-20: 1 mg via INTRAVENOUS

## 2023-09-20 MED ORDER — PROPOFOL 10 MG/ML IV BOLUS
INTRAVENOUS | Status: DC | PRN
  Administered 2023-09-20: 40 mg via INTRAVENOUS
  Administered 2023-09-20: 100 mg via INTRAVENOUS
  Administered 2023-09-20: 60 mg via INTRAVENOUS

## 2023-09-20 MED ORDER — PHENYLEPHRINE HCL-NACL 20-0.9 MG/250ML-% IV SOLN
INTRAVENOUS | Status: DC | PRN
  Administered 2023-09-20: 20 ug/min via INTRAVENOUS

## 2023-09-20 MED ORDER — ACETAMINOPHEN 325 MG PO TABS: 650.0000 mg | ORAL_TABLET | ORAL | Status: DC | PRN

## 2023-09-20 MED ORDER — DEXAMETHASONE SODIUM PHOSPHATE 10 MG/ML IJ SOLN
INTRAMUSCULAR | Status: DC | PRN
Start: 2023-09-20 — End: 2023-09-20
  Administered 2023-09-20: 10 mg via INTRAVENOUS

## 2023-09-20 MED ORDER — ONDANSETRON HCL 4 MG/2ML IJ SOLN
INTRAMUSCULAR | Status: DC | PRN
  Administered 2023-09-20: 4 mg via INTRAVENOUS

## 2023-09-20 MED ORDER — PROPOFOL 500 MG/50ML IV EMUL
INTRAVENOUS | Status: DC | PRN
  Administered 2023-09-20: 50 ug/kg/min via INTRAVENOUS

## 2023-09-20 MED ORDER — ACETAMINOPHEN 500 MG PO TABS
1000.0000 mg | ORAL_TABLET | Freq: Once | ORAL | Status: AC
  Administered 2023-09-20: 1000 mg via ORAL
  Filled 2023-09-20: qty 2

## 2023-09-20 MED ORDER — SODIUM CHLORIDE 0.9% FLUSH: 3.0000 mL | Freq: Two times a day (BID) | INTRAVENOUS | Status: DC

## 2023-09-20 MED ORDER — NITROGLYCERIN 1 MG/10 ML FOR IR/CATH LAB
INTRA_ARTERIAL | Status: DC | PRN
  Administered 2023-09-20: 20 mL via INTRAVENOUS

## 2023-09-20 MED ORDER — LIDOCAINE 2% (20 MG/ML) 5 ML SYRINGE
INTRAMUSCULAR | Status: DC | PRN
  Administered 2023-09-20: 60 mg via INTRAVENOUS

## 2023-09-20 MED ORDER — SODIUM CHLORIDE 0.9 % IV SOLN: INTRAVENOUS | Status: DC

## 2023-09-20 MED ORDER — SODIUM CHLORIDE 0.9 % IV SOLN: 250.0000 mL | INTRAVENOUS | Status: DC | PRN

## 2023-09-20 SURGICAL SUPPLY — 21 items
BAG SNAP BAND KOVER 36X36 (MISCELLANEOUS) IMPLANT
CABLE PFA RX CATH CONN (CABLE) IMPLANT
CATH 8FR REPROCESSED SOUNDSTAR (CATHETERS) ×1 IMPLANT
CATH 8FR SOUNDSTAR REPROCESSED (CATHETERS) IMPLANT
CATH FARAWAVE ABLATION 31 (CATHETERS) IMPLANT
CATH OCTARAY 2.0 F 3-3-3-3-3 (CATHETERS) IMPLANT
CATH WEB BI DIR CSDF CRV REPRO (CATHETERS) IMPLANT
CLOSURE PERCLOSE PROSTYLE (VASCULAR PRODUCTS) IMPLANT
COVER SWIFTLINK CONNECTOR (BAG) ×1 IMPLANT
DEVICE CLOSURE MYNXGRIP 6/7F (Vascular Products) IMPLANT
DILATOR VESSEL 38 20CM 16FR (INTRODUCER) IMPLANT
GUIDEWIRE INQWIRE 1.5J.035X260 (WIRE) IMPLANT
INQWIRE 1.5J .035X260CM (WIRE) ×1 IMPLANT
KIT VERSACROSS CNCT FARADRIVE (KITS) IMPLANT
PACK EP LF (CUSTOM PROCEDURE TRAY) ×1 IMPLANT
PAD DEFIB RADIO PHYSIO CONN (PAD) ×1 IMPLANT
PATCH CARTO3 (PAD) IMPLANT
SHEATH FARADRIVE STEERABLE (SHEATH) IMPLANT
SHEATH PINNACLE 8F 10CM (SHEATH) IMPLANT
SHEATH PINNACLE 9F 10CM (SHEATH) IMPLANT
SHEATH PROBE COVER 6X72 (BAG) IMPLANT

## 2023-09-20 NOTE — Anesthesia Postprocedure Evaluation (Signed)
 Anesthesia Post Note  Patient: Pamela Garner  Procedure(s) Performed: ATRIAL FIBRILLATION ABLATION     Patient location during evaluation: PACU Anesthesia Type: General Level of consciousness: awake and alert Pain management: pain level controlled Vital Signs Assessment: post-procedure vital signs reviewed and stable Respiratory status: spontaneous breathing, nonlabored ventilation and respiratory function stable Cardiovascular status: stable and blood pressure returned to baseline Anesthetic complications: no  There were no known notable events for this encounter.  Last Vitals:  Vitals:   09/20/23 1156 09/20/23 1200  BP: 101/86 101/86  Pulse: (!) 54 (!) 55  Resp: 15 (!) 21  Temp:    SpO2: 97% 95%    Last Pain:  Vitals:   09/20/23 1156  TempSrc:   PainSc: 0-No pain                 Beryle Lathe

## 2023-09-20 NOTE — Transfer of Care (Signed)
 Immediate Anesthesia Transfer of Care Note  Patient: Pamela Garner  Procedure(s) Performed: ATRIAL FIBRILLATION ABLATION  Patient Location: PACU and Cath Lab  Anesthesia Type:General  Level of Consciousness: awake, alert , and oriented  Airway & Oxygen Therapy: Patient Spontanous Breathing and Patient connected to nasal cannula oxygen  Post-op Assessment: Report given to RN and Post -op Vital signs reviewed and stable  Post vital signs: stable  Last Vitals:  Vitals Value Taken Time  BP 101/74 09/20/23 1010  Temp    Pulse 62 09/20/23 1011  Resp 17 09/20/23 1011  SpO2 96 % 09/20/23 1011  Vitals shown include unfiled device data.  Last Pain:  Vitals:   09/20/23 0603  TempSrc: Oral  PainSc:       Patients Stated Pain Goal: 4 (09/20/23 0555)  Complications: There were no known notable events for this encounter.

## 2023-09-20 NOTE — H&P (Signed)
 Electrophysiology Note:   Date:  09/20/2023  ID:  Pamela Garner, DOB Apr 28, 1941, MRN [id number]   Primary Cardiologist: Yvonne Kendall, MD Primary Heart Failure: None Electrophysiologist: Nobie Putnam, MD       History of Present Illness:   Pamela Garner is a 83 y.o. female with h/o hypertension, hyperlipidemia, thyroid disease who is being seen today for atrial fibrillation.    Patient presented to ED on 11/17 with double vision, lasting 4-5 days. She was found to have moderate-sized acute infarct in the right occipital parietal cortex on MRI. She was diagnosed with new onset atrial fibrillation in this setting. She was started on metoprolol and Eliquis. She wore a Zio monitor after discharge and was found to have AF burden of 5%. She appears to be asymptomatic and cannot tell when she is out of rhythm. She has otherwise recovered relatively well from her stroke. She has no new or acute complaints today. She has been taking Eliquis and is tolerating without issues.   Interval: Patient presents today for scheduled ablation. Reports doing relatively well. She has no new or acute complaints today. No missed doses of anti-coagulation.   Review of systems complete and found to be negative unless listed in HPI.    EP Information / Studies Reviewed:      EKG Interpretation Date/Time:                  Tuesday July 17 2023 14:10:26 EST Ventricular Rate:         63 PR Interval:                 320 QRS Duration:             132 QT Interval:                 430 QTC Calculation:440 R Axis:                         158   Text Interpretation:Sinus rhythm with 1st degree A-V block Right axis deviation Non-specific intra-ventricular conduction block Abnormal QRS-T angle, consider primary T wave abnormality When compared with ECG of 29-Jun-2023 15:32, No significant change was found Confirmed by Nobie Putnam [phone]) on 07/18/2023 1:32:33 PM    EKG 06/12/23: AFL    Zio 07/12/23:   The  patient was monitored for 13 days, 23 hours.  10 days, 2 hours were suitable for analysis after removal of artifact.   The predominant rhythm was sinus with an average rate of 64 bpm (range 45-99 bpm in sinus).   There were rare PACs and PVCs.   Paroxysmal atrial fibrillation was observed, with an average ventricular rate of 67 bpm (range 42-101 bpm in atrial fibrillation).  Longest episode lasted 4 hours, 25 minutes.  Atrial fibrillation burden was 5%.   No prolonged pause was seen.   Single patient triggered event corresponded to normal sinus rhythm.   Echo 06/13/23:  1. Left ventricular ejection fraction, by estimation, is 60 to 65%. The  left ventricle has normal function. The left ventricle has no regional  wall motion abnormalities. There is moderate left ventricular hypertrophy.  Left ventricular diastolic  parameters are indeterminate.   2. Right ventricular systolic function is normal. The right ventricular  size is normal.   3. The mitral valve is normal in structure. Mild mitral valve  regurgitation. No evidence of mitral stenosis.   4. The aortic valve is normal in structure. Aortic  valve regurgitation is  not visualized. No aortic stenosis is present.   5. The inferior vena cava is normal in size with greater than 50%  respiratory variability, suggesting right atrial pressure of 3 mmHg.   6. Rhythm is atrial flutter    Risk Assessment/Calculations:     CHA2DS2-VASc Score = 6   This indicates a 9.7% annual risk of stroke. The patient's score is based upon: CHF History: 0 HTN History: 1 Diabetes History: 0 Stroke History: 2 Vascular Disease History: 0 Age Score: 2 Gender Score: 1           Physical Exam:    Today's Vitals   09/20/23 0555 09/20/23 0603  BP:  (!) 144/66  Pulse:  (!) 57  Resp:  16  Temp:  97.7 F (36.5 C)  TempSrc:  Oral  SpO2:  97%  Weight:  83 kg  Height:  5\' 3"  (1.6 m)  PainSc: 0-No pain    Body mass index is 32.42 kg/m.   GEN: Well  nourished, well developed in no acute distress NECK: No JVD CARDIAC: Normal rate, regular rhythm RESPIRATORY:  Clear to auscultation without rales, wheezing or rhonchi  ABDOMEN: Soft, non-tender, non-distended EXTREMITIES:  No edema; No deformity    ASSESSMENT AND PLAN:   Pamela Garner is a 83 y.o. female with h/o hypertension, hyperlipidemia, thyroid disease who is being seen today for atrial fibrillation.    Patient presented to ED in November 2024 with an acute stroke. Found to have new typical atrial flutter in this setting. She wore a cardiac monitor on discharge which showed a 5% burden of atrial fibrillation.   #Typical atrial flutter: #Paroxysmal atrial fibrillation: - Given her stroke was presumptively secondary to atrial fibrillation/flutter, I think that maintaining sinus rhythm would be of value. She is currently tolerating Eliquis as stroke prophylaxis. 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 Tikosyn could be an option for her. We discussed antiarrhythmic drug therapy with Tikosyn vs catheter ablation. Discussed risks, recovery and likelihood of success with each treatment strategy. Risk, benefits, and alternatives to EP study and ablation for afib have been discussed. These risks include but are not limited to stroke, bleeding, vascular damage, tamponade, perforation, damage to the esophagus, lungs, phrenic nerve and other structures, pulmonary vein stenosis, worsening renal function, coronary vasospasm and death. The patient understands these risk and wishes to proceed with ablation today.  - Continue low dose metoprolol. Avoid further uptitration due to long first degree AV block and baseline conduction disease.  - Continue Eliquis.   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   Signed, Nobie Putnam, MD

## 2023-09-20 NOTE — Anesthesia Procedure Notes (Signed)
 Procedure Name: Intubation Date/Time: 09/20/2023 7:52 AM  Performed by: Vena Austria, CRNAPre-anesthesia Checklist: Patient identified, Emergency Drugs available, Suction available, Patient being monitored and Timeout performed Patient Re-evaluated:Patient Re-evaluated prior to induction Oxygen Delivery Method: Circle system utilized Preoxygenation: Pre-oxygenation with 100% oxygen Induction Type: IV induction Grade View: Grade I Tube type: Oral Tube size: 7.0 mm Number of attempts: 1 Airway Equipment and Method: Stylet and Video-laryngoscopy Placement Confirmation: ETT inserted through vocal cords under direct vision, positive ETCO2, CO2 detector and breath sounds checked- equal and bilateral Secured at: 21 cm Tube secured with: Tape Dental Injury: Teeth and Oropharynx as per pre-operative assessment

## 2023-09-20 NOTE — Discharge Instructions (Signed)

## 2023-09-20 NOTE — Progress Notes (Signed)
 Patient ambulated to BR. Both groins remain unremarkable.

## 2023-09-21 ENCOUNTER — Telehealth (HOSPITAL_COMMUNITY): Payer: Self-pay

## 2023-09-21 MED FILL — Fentanyl Citrate Preservative Free (PF) Inj 100 MCG/2ML: INTRAMUSCULAR | Qty: 2 | Status: AC

## 2023-09-21 NOTE — Telephone Encounter (Signed)
 Spoke with patient to complete post procedure follow up call.  Patient reports no complications with groin sites.   Instructions reviewed with patient:  Remove large bandage at puncture site after 24 hours. It is normal to have bruising, tenderness and a pea or marble sized lump/knot at the groin site which can take up to three months to resolve.  Get help right away if you notice sudden swelling at the puncture site.  Check your puncture site every day for signs of infection: fever, redness, swelling, pus drainage, warmth, foul odor or excessive pain. If this occurs, please call the office at [phone], to speak with the nurse. Get help right away if your puncture site is bleeding and the bleeding does not stop after applying firm pressure to the area.  You may continue to have skipped beats/ atrial fibrillation during the first several months after your procedure.  It is very important not to miss any doses of your blood thinner Eliquis. Patient restarted taking this medication on yesterday, 09/20/23.   You will follow up with the APP on 10/23/23 and follow up with the APP on 12/24/23.   Patient verbalized understanding to all instructions provided.

## 2023-10-22 NOTE — Progress Notes (Deleted)
 Electrophysiology Clinic Note    Date:  10/22/2023  Patient ID:  Pamela Garner, Pamela Garner [birth year], MRN [id number] PCP:  Melchor Spoon, MD  Cardiologist:  Sammy Crisp, MD Electrophysiologist: Ardeen Kohler, MD  ***refresh  Discussed the use of AI scribe software for clinical note transcription with the patient, who gave verbal consent to proceed.   Patient Profile    Chief Complaint: ***  History of Present Illness: Pamela Garner is a 83 y.o. female with PMH notable for AFib, Aflutter, HTN, CVA; seen today for Ardeen Kohler, MD for routine electrophysiology followup.   Her AF was initial diagnosed during hospitalization for CVA. She is s/p AF, aflutter ablation with PVI, posterior wall, and CTI on 09/20/2023.   On follow-up today,  *** AF burden, symptoms *** palpitations *** bleeding concerns  - [name] is only AAD option   Since last being seen in our clinic the patient reports doing ***.  she denies chest pain, palpitations, dyspnea, PND, orthopnea, nausea, vomiting, dizziness, syncope, edema, weight gain, or early satiety.      Arrhythmia/Device History No specialty comments available.      ROS:  Please see the history of present illness. All other systems are reviewed and otherwise negative.    Physical Exam    VS:  There were no vitals taken for this visit. BMI: There is no height or weight on file to calculate BMI.  Wt Readings from Last 3 Encounters:  09/20/23 183 lb (83 kg)  07/17/23 187 lb 9.6 oz (85.1 kg)  06/29/23 186 lb 12.8 oz (84.7 kg)     GEN- The patient is well appearing, alert and oriented x 3 today.   Lungs- Clear to ausculation bilaterally, normal work of breathing.  Heart- {Blank single:19197::"Regular","Irregularly irregular"} rate and rhythm, no murmurs, rubs or gallops Extremities- {EDEMA LEVEL:28147::"No"} peripheral edema, warm, dry    Studies Reviewed   Previous EP, cardiology notes.    EKG is ordered. Personal  review of EKG from today shows:  ***        Chest CT Over-read, 08/27/2023 Chronic interstitial lung disease  Cardiac CTA, 08/27/2023 1. There is normal pulmonary vein drainage into the left atrium. (2 on the right and 2 on the left) with ostial measurements as above.  2. The left atrial appendage is a Windsock-cactus type with ostial size 31 x 26 mm and length 54 mm, Area 60 mm2. There is no thrombus in the left atrial appendage.  3. The esophagus runs in the left atrial midline and is not in the proximity to any of the pulmonary veins.  4. Coronary calcium  score of 0.  Long term monitor, 08/27/2023   The patient was monitored for 13 days, 23 hours.  10 days, 2 hours were suitable for analysis after removal of artifact.   The predominant rhythm was sinus with an average rate of 64 bpm (range 45-99 bpm in sinus).   There were rare PACs and PVCs.   Paroxysmal atrial fibrillation was observed, with an average ventricular rate of 67 bpm (range 42-101 bpm in atrial fibrillation).  Longest episode lasted 4 hours, 25 minutes.  Atrial fibrillation burden was 5%.   No prolonged pause was seen.   Single patient triggered event corresponded to normal sinus rhythm.   Predominantly sinus rhythm with paroxysmal atrial fibrillation, as detailed above.  TTE, 06/13/2023  1. Left ventricular ejection fraction, by estimation, is 60 to 65%. The left ventricle has normal function. The left ventricle has  no regional wall motion abnormalities. There is moderate left ventricular hypertrophy. Left ventricular diastolic parameters are indeterminate.   2. Right ventricular systolic function is normal. The right ventricular size is normal.   3. The mitral valve is normal in structure. Mild mitral valve regurgitation. No evidence of mitral stenosis.   4. The aortic valve is normal in structure. Aortic valve regurgitation is not visualized. No aortic stenosis is present.   5. The inferior vena cava is normal in size with  greater than 50% respiratory variability, suggesting right atrial pressure of 3 mmHg.   6. Rhythm is atrial flutter    Assessment and Plan     #) typical atrial flutter #) parox Afib S/p AFib, Aflu ablation 08/2023 by DR. Parker Maintaining sinus ***  #) Hypercoag d/t parox afib #) CVA CHA2DS2-VASc Score = at least 6 [CHF History: 0, HTN History: 1, Diabetes History: 0, Stroke History: 2, Vascular Disease History: 0, Age Score: 2, Gender Score: 1].  Therefore, the patient's annual risk of stroke is 9.7 %.    Stroke ppx - 5mg  eliquis  BID, appropriately dosed No bleeding concerns   #) ***   {Are you ordering a CV Procedure (e.g. stress test, cath, DCCV, TEE, etc)?   Press F2        :[id number]}   Current medicines are reviewed at length with the patient today.   The patient {ACTIONS; HAS/DOES NOT HAVE:19233} concerns regarding her medicines.  The following changes were made today:  {NONE DEFAULTED:18576}  Labs/ tests ordered today include: *** No orders of the defined types were placed in this encounter.    Disposition: Follow up with {EPMDS:28135} or EP APP {EPFOLLOW UP:28173}   Signed, Kenly Henckel, NP  10/22/23  6:55 PM  Electrophysiology [facility]

## 2023-10-23 ENCOUNTER — Ambulatory Visit: Attending: Cardiology | Admitting: Cardiology

## 2023-10-23 DIAGNOSIS — Z8673 Personal history of transient ischemic attack (TIA), and cerebral infarction without residual deficits: Secondary | ICD-10-CM

## 2023-10-23 DIAGNOSIS — I480 Paroxysmal atrial fibrillation: Secondary | ICD-10-CM

## 2023-10-23 DIAGNOSIS — D6869 Other thrombophilia: Secondary | ICD-10-CM

## 2023-10-23 DIAGNOSIS — I483 Typical atrial flutter: Secondary | ICD-10-CM

## 2023-11-13 ENCOUNTER — Ambulatory Visit

## 2023-11-13 ENCOUNTER — Ambulatory Visit: Attending: Neurology | Admitting: Speech Pathology

## 2023-11-13 DIAGNOSIS — G8929 Other chronic pain: Secondary | ICD-10-CM | POA: Insufficient documentation

## 2023-11-13 DIAGNOSIS — R262 Difficulty in walking, not elsewhere classified: Secondary | ICD-10-CM

## 2023-11-13 DIAGNOSIS — R1312 Dysphagia, oropharyngeal phase: Secondary | ICD-10-CM | POA: Insufficient documentation

## 2023-11-13 DIAGNOSIS — R41841 Cognitive communication deficit: Secondary | ICD-10-CM | POA: Diagnosis present

## 2023-11-13 DIAGNOSIS — M6281 Muscle weakness (generalized): Secondary | ICD-10-CM

## 2023-11-13 DIAGNOSIS — R2689 Other abnormalities of gait and mobility: Secondary | ICD-10-CM | POA: Insufficient documentation

## 2023-11-13 DIAGNOSIS — M25561 Pain in right knee: Secondary | ICD-10-CM | POA: Insufficient documentation

## 2023-11-13 NOTE — Therapy (Signed)
 OUTPATIENT SPEECH LANGUAGE PATHOLOGY  COGNITION EVALUATION DYSPHAGIA EVALUATION   Patient Name: Pamela Garner MRN: [id number] DOB:[birth year], 83 y.o., female Today's Date: 11/13/2023  PCP: Burman Carrie, MD REFERRING PROVIDER: Devora Folks, MD   End of Session - 11/13/23 1425     Visit Number 1    Number of Visits 25    Date for SLP Re-Evaluation 02/05/24    Authorization Type [employer]    Progress Note Due on Visit 10    SLP Start Time 1445    SLP Stop Time  1530    SLP Time Calculation (min) 45 min    Activity Tolerance Patient tolerated treatment well             Past Medical History:  Diagnosis Date   Degenerative joint disease    Dermatophytosis, nail    feet   Hyperlipidemia    Hypertension    Lichen sclerosus    Thyroid disease    Trigeminal neuralgia of left side of face    Past Surgical History:  Procedure Laterality Date   ATRIAL FIBRILLATION ABLATION N/A 09/20/2023   Procedure: ATRIAL FIBRILLATION ABLATION;  Surgeon: Ardeen Kohler, MD;  Location: [facility];  Service: Cardiovascular;  Laterality: N/A;   COLONOSCOPY  2008   DILATION AND CURETTAGE OF UTERUS  1985   endometrial polyps   Patient Active Problem List   Diagnosis Date Noted   New onset atrial flutter (HCC) 06/13/2023   Diplopia 06/12/2023   CVA (cerebral vascular accident) (HCC) 06/12/2023   Atrial fibrillation (HCC) 06/12/2023   GERD (gastroesophageal reflux disease) 10/14/2019   RBBB (right bundle branch block) 10/14/2019   Lichen sclerosus et atrophicus of the vulva 06/13/2017   Thyroid disease    Hypertension    Hyperlipidemia    Degenerative joint disease     ONSET DATE: 06/12/2023; date of referral  11/06/2023  REFERRING DIAG: I63.9 (ICD-10-CM) - CVA (cerebral vascular accident) (HCC)   THERAPY DIAG:  Cognitive communication deficit  Dysphagia, oropharyngeal phase  Rationale for Evaluation and Treatment Rehabilitation  SUBJECTIVE:    SUBJECTIVE STATEMENT: Pt pleasant, eager, conversant, arrived early to appt d/t "thought it was earlier" Pt accompanied by: self  PERTINENT HISTORY: Pt is a 83 year old female with medical history of atrial fibrillation, hypertension, hyperlipidemia, potential hearing loss, snoring and CVA (06/07/2023).  MRI revealed 06/12/2023 FINDINGS: Brain: Restricted diffusion involving the right occipital parietal cortex. A moderate area is affected. Chronic small vessel ischemia in the cerebral white matter and pons which is mild for age. Small chronic left cerebellar infarct.    PAIN:  Are you having pain? No   FALLS: Has patient fallen in last 6 months?  See PT evaluation for details  LIVING ENVIRONMENT: Lives with: lives with their spouse Lives in: House/apartment  PLOF:  Level of assistance: Independent with ADLs, Independent with IADLs Employment: Retired   PATIENT GOALS   continue to maintain and/or improve cognitive communication abilities  OBJECTIVE:   COGNITIVE COMMUNICATION Overall cognitive status: No family/caregiver present to determine baseline cognitive functioning Areas of impairment:  Memory: Impaired: Short term Functional Impairments: Pt reports functional use of external aids to help with memory but she had her calendar at home and unable to refer to it therefore she arrived to this appt 1 hour early at incorrect time.   AUDITORY COMPREHENSION  Overall auditory comprehension: Appears intact YES/NO questions: Appears intact Following directions: Appears intact Conversation: Complex Interfering components: hearing Effective technique: repetition/stressing words, slowed  speech, and stressing words  READING COMPREHENSION: Intact  EXPRESSION: verbal  VERBAL EXPRESSION:   Overall verbal expression: Appears intact Level of generative/spontaneous verbalization: conversation Automatic speech: name: intact and social response: intact  Repetition: Appears  intact Naming: Responsive: 76-100%, Confrontation: 76-100%, Convergent: 76-100%, and Divergent: 51-75% Pragmatics: Appears intact Comments: pt's verbal expression appears adequate. She is able to convey unknown information to this Clinical research associate with great listener understanding.  Non-verbal means of communication: N/A  WRITTEN EXPRESSION: Dominant hand: right Written expression: Appears intact  ORAL MOTOR EXAMINATION Facial : WFL Lingual: WFL Velum: WFL Mandible: WFL Cough: WFL Voice: WFL  MOTOR SPEECH: Overall motor speech: Appears intact Phonation: normal Resonance: WFL Articulation: Appears intact Intelligibility: Intelligible Motor planning: Appears intact    STANDARDIZED ASSESSMENTS: Addenbrooke's Cognitive Examination - ACE III The Addenbrooke's Cognitive Examination-III (ACE-III) is a brief cognitive test that assesses five cognitive domains. The total score is 100 with higher scores indicating better cognitive functioning. Cut off scores of 88 and 82 are recommended for suspicion of dementia (88 has sensitivity of 1.00 and specificity of 0.96, 82 has sensitivity of 0.93 and specificity of 1.00). American Version A  Attention 14/18  Memory 25/26  Fluency 5/14  Language 24/26  Visuospatial 14/16  TOTAL ACE- III Score 82/100    Objective Swallow Assessment -  Pt presents with adequate oropharyngeal abilities when consuming regular with thin liquids, medicine whole with thin liquids.   PATIENT REPORTED OUTCOME MEASURES (PROM): To be completed over the next 3 sessions   TODAY'S TREATMENT:  N/A   PATIENT EDUCATION: Education details: ST services, nature of referral, ST POC Person educated: Patient Education method: Explanation Education comprehension: needs further education   HOME EXERCISE PROGRAM:   TBD   GOALS:  Goals reviewed with patient? Yes  SHORT TERM GOALS: Target date: 10 sessions  With Mod I, pt will demo knowledge that he needs to use  practical compensation in a situation which has been difficult in the past, x2 sessions Baseline: Goal status: INITIAL   LONG TERM GOALS: Target date: 02/05/2024  With Mod I, patient will use strategies to improve memory for important information with 90% acc. Independently (ie., white board, daily planner/calendar, Apps on phone).   Baseline:  Goal status: INITIAL  2.  With Mod I, patient will demonstrate understanding of functional cognitive activities by listing 2 activities for home maintenance program. Baseline:  Goal status: INITIAL  3.  Pt will report improved cognitive communication via PROM by 5 points at last ST session   Baseline:  Goal status: INITIAL   ASSESSMENT:  CLINICAL IMPRESSION: Patient is a 83 y.o. female who was seen today for a cognitive communication and clinical swallow evaluation d/t recent CVA. Pt presents with very mild cognitive impairment .   OBJECTIVE IMPAIRMENTS include memory and executive functioning. These impairments are limiting patient from managing appointments. Factors affecting potential to achieve goals and functional outcome are ability to learn/carryover information. Patient will benefit from skilled SLP services to address above impairments and improve overall function.  REHAB POTENTIAL: Excellent  PLAN: SLP FREQUENCY: 1-2x/week  SLP DURATION: 8 weeks  PLANNED INTERVENTIONS: Internal/external aids, Functional tasks, SLP instruction and feedback, Compensatory strategies, and Patient/family education   Dajon Rowe B. Garlin Junker, M.S., CCC-SLP, Tree surgeon Certified Brain Injury Specialist [facility]  [facility] Rehabilitation Services Office [phone] Ascom [phone] Fax [phone]

## 2023-11-13 NOTE — Therapy (Signed)
 OUTPATIENT PHYSICAL THERAPY NEURO EVALUATION   Patient Name: Pamela Garner MRN: [id number] DOB:15-Jun-1941, 83 y.o., female Today's Date: 11/13/2023   PCP: Melchor Spoon, MD  REFERRING PROVIDER: Melchor Spoon, MD   END OF SESSION:  PT End of Session - 11/13/23 1516     Visit Number 1    Number of Visits 25    Date for PT Re-Evaluation 02/05/24    Progress Note Due on Visit 10    PT Start Time 1520    PT Stop Time 1600    PT Time Calculation (min) 40 min    Equipment Utilized During Treatment Gait belt    Activity Tolerance Patient tolerated treatment well             Past Medical History:  Diagnosis Date   Degenerative joint disease    Dermatophytosis, nail    feet   Hyperlipidemia    Hypertension    Lichen sclerosus    Thyroid disease    Trigeminal neuralgia of left side of face    Past Surgical History:  Procedure Laterality Date   ATRIAL FIBRILLATION ABLATION N/A 09/20/2023   Procedure: ATRIAL FIBRILLATION ABLATION;  Surgeon: Ardeen Kohler, MD;  Location: [facility];  Service: Cardiovascular;  Laterality: N/A;   COLONOSCOPY  2008   DILATION AND CURETTAGE OF UTERUS  1985   endometrial polyps   Patient Active Problem List   Diagnosis Date Noted   New onset atrial flutter (HCC) 06/13/2023   Diplopia 06/12/2023   CVA (cerebral vascular accident) (HCC) 06/12/2023   Atrial fibrillation (HCC) 06/12/2023   GERD (gastroesophageal reflux disease) 10/14/2019   RBBB (right bundle branch block) 10/14/2019   Lichen sclerosus et atrophicus of the vulva 06/13/2017   Thyroid disease    Hypertension    Hyperlipidemia    Degenerative joint disease     ONSET DATE: 06/10/23  REFERRING DIAG: I63.9 (ICD-10-CM) - CVA (cerebral vascular accident) (HCC)  THERAPY DIAG:  Difficulty in walking, not elsewhere classified  Muscle weakness (generalized)  Imbalance  Chronic pain of right knee  Rationale for Evaluation and Treatment:  Rehabilitation  SUBJECTIVE:                                                                                                                                                                                             SUBJECTIVE STATEMENT:  Pt reports she feels good since the stroke.  She felt that she didn't even know that she had a stroke.  Pt reports she has been having knee issues lately.  Pt reports that Dr. Hobart Lulas stated that the pt's  heart was too weak to have knee surgery, so he recommended knee injections.  Pt reports she is not sure about how much PT she will be able to tolerate due to the knees at this time.     Pt accompanied by: self  PERTINENT HISTORY: medical history significant of obesity, hypertension, hyperlipidemia, thyroid disease presenting with CVA and new onset atrial fibrillation.   PAIN:  Are you having pain? Yes: NPRS scale: 5/10 Pain location: R Knee Pain description: achey Aggravating factors: Standing for prolonged periods Relieving factors: Arthritis cream and gels  PRECAUTIONS: Fall  RED FLAGS: None   WEIGHT BEARING RESTRICTIONS: No  FALLS: Has patient fallen in last 6 months? Yes. Number of falls 3  LIVING ENVIRONMENT: Lives with: lives with their spouse Lives in: House/apartment Stairs: Yes: External: 3 steps; on right going up Has following equipment at home: Single point cane and Walker - 2 wheeled  PLOF: Independent  PATIENT GOALS: Pt is most concerned with the R knee and working it too much.   OBJECTIVE: Note: Objective measures were completed at Evaluation unless otherwise noted.  DIAGNOSTIC FINDINGS:   EXAM: MRI HEAD WITHOUT CONTRAST  IMPRESSION: Moderate size acute infarct in the right occipital parietal cortex.   COGNITION: Overall cognitive status: Within functional limits for tasks assessed   SENSATION: WFL  COORDINATION: WFL    LOWER EXTREMITY ROM:     Active  Right Eval Left Eval  Hip flexion    Hip  extension    Hip abduction    Hip adduction    Hip internal rotation    Hip external rotation    Knee flexion    Knee extension    Ankle dorsiflexion    Ankle plantarflexion    Ankle inversion    Ankle eversion     (Blank rows = not tested)  LOWER EXTREMITY MMT:    MMT Right Eval Left Eval  Hip flexion    Hip extension    Hip abduction    Hip adduction    Hip internal rotation    Hip external rotation    Knee flexion    Knee extension    Ankle dorsiflexion    Ankle plantarflexion    Ankle inversion    Ankle eversion    (Blank rows = not tested)  BED MOBILITY:  Not tested  TRANSFERS: Sit to stand: Modified independence  Assistive device utilized: None     Stand to sit: Modified independence  Assistive device utilized: None     Chair to chair: Modified independence  Assistive device utilized: None       STAIRS: Findings: Level of Assistance: Modified independence, Psychologist, counselling Technique: Step to [employer] Forwards With use of AD: cane but not utilized at all with Single Rail on Right, Number of Stairs: 4, Height of Stairs: 6"   , and Comments: Pt descends stairs backwards due to reduced pain when performing this way.  FUNCTIONAL TESTS:  5 times sit to stand: 24.89 sec with UE push up from arm rests Timed up and go (TUG): 26.98 with no AD 6 minute walk test: TBD at next visit 10 meter walk test: 29.08 with no AD, 20.65 with cane Berg Balance Scale: TBD at next visit  PATIENT SURVEYS:  ABC scale 36.25% LEFS 25/80  TREATMENT DATE: 11/13/23   Self-Care  Pt given education on current POC, the findings of the evaluation, and the ways in which skilled therapy can address the current deficits in balance and musculature strength.  Pt instructed on how this can improve their overall function and maintain/improve their overall  quality of life.  Pt also informed on how therapy can reduce the pain her knees prior to having injections.    PATIENT EDUCATION: Education details: Pt educated on role of PT and services provided during current POC, along with prognosis and information about the clinic. Person educated: Patient Education method: Explanation Education comprehension: verbalized understanding  HOME EXERCISE PROGRAM:  To be given at the next visit.  GOALS: Goals reviewed with patient? Yes  SHORT TERM GOALS: Target date: 12/11/2023  Pt will be independent with HEP in order to demonstrate increased ability to perform tasks related to occupation/hobbies. Baseline:  Pt to be given HEP at the next visit. Goal status: INITIAL   LONG TERM GOALS: Target date: 02/05/2024  1.  Patient (> 60 years old) will complete five times sit to stand test in < 15 seconds indicating an increased LE strength and improved balance. Baseline: 24.89 sec Goal status: INITIAL  2.  Patient will improve the LEFS by 11% in order to demonstrate functional independence without pain. Baseline: 20/80; 25%  Goal status: INITIAL   3.  Patient will increase Berg Balance score by > 6 points to demonstrate decreased fall risk during functional activities. Baseline: TBD at next visit Goal status: INITIAL   3.  Patient will reduce timed up and go to <11 seconds to reduce fall risk and demonstrate improved transfer/gait ability. Baseline: 26.98 sec with no AD Goal status: INITIAL  4.  Patient will increase 10 meter walk test to >1.88m/s as to improve gait speed for better community ambulation and to reduce fall risk. Baseline: 29.08 with no AD; 20.65 with cane Goal status: INITIAL  5.  Patient will increase six minute walk test distance to >1000 for progression to community ambulator and improve gait ability Baseline: TBD at next visit Goal status: INITIAL   ASSESSMENT:  CLINICAL IMPRESSION: Patient is an 83 y.o. female who was  seen today for physical therapy evaluation and treatment for s/p CVA in December 2024.  Pt presents with physical impairments of decreased activity tolerance, increased pain in R knee, and decreased strength in B LE's as noted.  Pt also with significant deficits in overall balance from the CVA.  Pt will benefit from skilled therapy to address tolerance, balance, pain, and strength impairments necessary for improvement in quality of life.  Pt. demonstrates understanding of this plan of care and agrees with this plan.    OBJECTIVE IMPAIRMENTS: Abnormal gait, decreased activity tolerance, decreased balance, decreased endurance, decreased mobility, difficulty walking, decreased strength, and pain.   ACTIVITY LIMITATIONS: carrying, lifting, bending, sitting, standing, squatting, stairs, transfers, and locomotion level  PARTICIPATION LIMITATIONS: meal prep, cleaning, laundry, driving, shopping, community activity, and yard work  PERSONAL FACTORS: Age, Fitness, Past/current experiences, Time since onset of injury/illness/exacerbation, and 3+ comorbidities: HTN, CVA, Afib, DJD, R knee pain are also affecting patient's functional outcome.   REHAB POTENTIAL: Good  CLINICAL DECISION MAKING: Evolving/moderate complexity  EVALUATION COMPLEXITY: Moderate  PLAN:  PT FREQUENCY: 2x/week  PT DURATION: 12 weeks  PLANNED INTERVENTIONS: 97750- Physical Performance Testing, 97110-Therapeutic exercises, 97530- Therapeutic activity, 97112- Neuromuscular re-education, 97535- Self Care, [id number]- Manual therapy, [id number]- Gait training, Balance training, Stair training, Dry Needling, Joint mobilization,  Joint manipulation, Spinal manipulation, Spinal mobilization, Vestibular training, Cryotherapy, and Moist heat  PLAN FOR NEXT SESSION:   Continue with assessment including:  BERG/DGI whatever is most appropriate 6 min walk test Establish HEP   Rozanna Corner, PT, DPT Physical Therapist - [facility]  11/13/23, 4:46 PM

## 2023-11-21 NOTE — Therapy (Unsigned)
 OUTPATIENT PHYSICAL THERAPY NEURO TREATMENT   Patient Name: Pamela Garner MRN: [id number] DOB:1941/03/02, 83 y.o., female Today's Date: 11/23/2023   PCP: Melchor Spoon, MD  REFERRING PROVIDER: Melchor Spoon, MD   END OF SESSION:  PT End of Session - 11/22/23 1552     Visit Number 2    Number of Visits 25    Date for PT Re-Evaluation 02/05/24    Progress Note Due on Visit 10    PT Start Time 1615    PT Stop Time 1655    PT Time Calculation (min) 40 min    Equipment Utilized During Treatment Gait belt    Activity Tolerance Patient tolerated treatment well              Past Medical History:  Diagnosis Date   Degenerative joint disease    Dermatophytosis, nail    feet   Hyperlipidemia    Hypertension    Lichen sclerosus    Thyroid disease    Trigeminal neuralgia of left side of face    Past Surgical History:  Procedure Laterality Date   ATRIAL FIBRILLATION ABLATION N/A 09/20/2023   Procedure: ATRIAL FIBRILLATION ABLATION;  Surgeon: Ardeen Kohler, MD;  Location: [facility];  Service: Cardiovascular;  Laterality: N/A;   COLONOSCOPY  2008   DILATION AND CURETTAGE OF UTERUS  1985   endometrial polyps   Patient Active Problem List   Diagnosis Date Noted   New onset atrial flutter (HCC) 06/13/2023   Diplopia 06/12/2023   CVA (cerebral vascular accident) (HCC) 06/12/2023   Atrial fibrillation (HCC) 06/12/2023   GERD (gastroesophageal reflux disease) 10/14/2019   RBBB (right bundle branch block) 10/14/2019   Lichen sclerosus et atrophicus of the vulva 06/13/2017   Thyroid disease    Hypertension    Hyperlipidemia    Degenerative joint disease     ONSET DATE: 06/10/23  REFERRING DIAG: I63.9 (ICD-10-CM) - CVA (cerebral vascular accident) (HCC)  THERAPY DIAG:  Difficulty in walking, not elsewhere classified  Muscle weakness (generalized)  Imbalance  Rationale for Evaluation and Treatment: Rehabilitation  SUBJECTIVE:                                                                                                                                                                                              SUBJECTIVE STATEMENT:  Pt reports no changes since last session.   Pt accompanied by: self  PERTINENT HISTORY: medical history significant of obesity, hypertension, hyperlipidemia, thyroid disease presenting with CVA and new onset atrial fibrillation.   PAIN:  Are you having pain? Yes: NPRS scale: 5/10 Pain  location: R Knee Pain description: achey Aggravating factors: Standing for prolonged periods Relieving factors: Arthritis cream and gels  PRECAUTIONS: Fall  RED FLAGS: None   WEIGHT BEARING RESTRICTIONS: No  FALLS: Has patient fallen in last 6 months? Yes. Number of falls 3  LIVING ENVIRONMENT: Lives with: lives with their spouse Lives in: House/apartment Stairs: Yes: External: 3 steps; on right going up Has following equipment at home: Single point cane and Walker - 2 wheeled  PLOF: Independent  PATIENT GOALS: Pt is most concerned with the R knee and working it too much.   OBJECTIVE: Note: Objective measures were completed at Evaluation unless otherwise noted.  DIAGNOSTIC FINDINGS:   EXAM: MRI HEAD WITHOUT CONTRAST  IMPRESSION: Moderate size acute infarct in the right occipital parietal cortex.   COGNITION: Overall cognitive status: Within functional limits for tasks assessed   SENSATION: WFL  COORDINATION: WFL    LOWER EXTREMITY ROM:     Active  Right Eval Left Eval  Hip flexion    Hip extension    Hip abduction    Hip adduction    Hip internal rotation    Hip external rotation    Knee flexion    Knee extension    Ankle dorsiflexion    Ankle plantarflexion    Ankle inversion    Ankle eversion     (Blank rows = not tested)  LOWER EXTREMITY MMT:    MMT Right Eval Left Eval  Hip flexion    Hip extension    Hip abduction    Hip adduction    Hip internal rotation     Hip external rotation    Knee flexion    Knee extension    Ankle dorsiflexion    Ankle plantarflexion    Ankle inversion    Ankle eversion    (Blank rows = not tested)  BED MOBILITY:  Not tested  TRANSFERS: Sit to stand: Modified independence  Assistive device utilized: None     Stand to sit: Modified independence  Assistive device utilized: None     Chair to chair: Modified independence  Assistive device utilized: None       STAIRS: Findings: Level of Assistance: Modified independence, Psychologist, counselling Technique: Step to [employer] Forwards With use of AD: cane but not utilized at all with Single Rail on Right, Number of Stairs: 4, Height of Stairs: 6"   , and Comments: Pt descends stairs backwards due to reduced pain when performing this way.  FUNCTIONAL TESTS:  5 times sit to stand: 24.89 sec with UE push up from arm rests Timed up and go (TUG): 26.98 with no AD 6 minute walk test: TBD at next visit 10 meter walk test: 29.08 with no AD, 20.65 with cane Berg Balance Scale: TBD at next visit  PATIENT SURVEYS:  ABC scale 36.25% LEFS 25/80  TREATMENT DATE: 11/23/23  PHYSICAL PERFORMANCE  6 Min Walk Test:  Instructed patient to ambulate as quickly and as safely as possible for 6 minutes using LRAD. Patient was allowed to take standing rest breaks without stopping the test, but if the patient required a sitting rest break the clock would be stopped and the test would be over.  Results: 465 feet using a SPC with SBA. Results indicate that the patient has reduced endurance with ambulation compared to age matched norms.  Age Matched Norms (in meters): 30-69 yo M: 15 F: 6, 26-79 yo M: 29 F: 471, 39-89 yo M: 417 F: 392 MDC: 58.21 meters (190.98 feet) or 50 meters (ANPTA Core Set of Outcome Measures for Adults with Neurologic Conditions,  2018)  Patient demonstrates increased fall risk as noted by score of  42 /56 on Berg Balance Scale.  (<36= high risk for falls, close to 100%; 37-45 significant >80%; 46-51 moderate >50%; 52-55 lower >25%)  [facility] PT Assessment - 11/23/23 0001       Berg Balance Test   Sit to Stand Able to stand without using hands and stabilize independently    Standing Unsupported Able to stand safely 2 minutes    Sitting with Back Unsupported but Feet Supported on Floor or Stool Able to sit safely and securely 2 minutes    Stand to Sit Sits safely with minimal use of hands    Transfers Able to transfer safely, minor use of hands    Standing Unsupported with Eyes Closed Able to stand 10 seconds safely    Standing Unsupported with Feet Together Able to place feet together independently and stand 1 minute safely    From Standing, Reach Forward with Outstretched Arm Can reach forward >5 cm safely (2")    From Standing Position, Pick up Object from Floor Able to pick up shoe, needs supervision    From Standing Position, Turn to Look Behind Over each Shoulder Turn sideways only but maintains balance    Turn 360 Degrees Able to turn 360 degrees safely but slowly    Standing Unsupported, Alternately Place Feet on Step/Stool Able to complete >2 steps/needs minimal assist    Standing Unsupported, One Foot in Front Able to plae foot ahead of the other independently and hold 30 seconds    Standing on One Leg Tries to lift leg/unable to hold 3 seconds but remains standing independently    Total Score 42              TE- To improve strength, endurance, mobility, and function of specific targeted muscle groups or improve joint range of motion or improve muscle flexibility  Exercises - Seated Long Arc Quad  - 2 sets - 10 reps - 1 sec hold - Seated March  - 2 sets - 10 reps - Sit to Stand  -- 2 sets - 8 reps   PATIENT EDUCATION: Education details: Pt educated on role of PT and services provided during current  POC, along with prognosis and information about the clinic. Person educated: Patient Education method: Explanation Education comprehension: verbalized understanding  HOME EXERCISE PROGRAM:  Access Code: [phone] URL: [email or web address] Date: 11/23/2023 Prepared by: Marlynn Singer  Exercises - Seated Long Arc Quad  - 1 x daily - 7 x weekly - 2 sets - 10 reps - 1 sec hold - Seated March  - 1 x daily - 7 x weekly - 2 sets - 10 reps - Sit to Stand  - 1 x daily -  7 x weekly - 2 sets - 8 reps  GOALS: Goals reviewed with patient? Yes  SHORT TERM GOALS: Target date: 12/11/2023  Pt will be independent with HEP in order to demonstrate increased ability to perform tasks related to occupation/hobbies. Baseline:  Pt to be given HEP at the next visit. Goal status: INITIAL   LONG TERM GOALS: Target date: 02/05/2024  1.  Patient (> 31 years old) will complete five times sit to stand test in < 15 seconds indicating an increased LE strength and improved balance. Baseline: 24.89 sec Goal status: INITIAL  2.  Patient will improve the LEFS by 11% in order to demonstrate functional independence without pain. Baseline: 20/80; 25%  Goal status: INITIAL   3.  Patient will increase Berg Balance score by > 6 points to demonstrate decreased fall risk during functional activities. Baseline: 42 Goal status: INITIAL   3.  Patient will reduce timed up and go to <11 seconds to reduce fall risk and demonstrate improved transfer/gait ability. Baseline: 26.98 sec with no AD Goal status: INITIAL  4.  Patient will increase 10 meter walk test to >1.58m/s as to improve gait speed for better community ambulation and to reduce fall risk. Baseline: 29.08 with no AD; 20.65 with cane Goal status: INITIAL  5.  Patient will increase six minute walk test distance to >1000 for progression to community ambulator and improve gait ability Baseline: 465 ft with SPC  Goal status:  INITIAL   ASSESSMENT:  CLINICAL IMPRESSION: Patient is a verbose and pleasant 83 y.o. female who was seen today for physical therapy  treatment for s/p CVA in December 2024.  Patient educated regarding benefits of gentle activities for lower extremity strength as well as joint health.  Patient introduced initial home exercise program as well as continued with assessments for fall risk and overall mobility as a follow-up from the initial evaluation.  Patient 6-minute walk test demonstrates significant limitations in overall mobility.  Patient's Berg balance score indicates increased risk of falls.  Based on these as well as other findings during the initial evaluation patient will significantly benefit from physical therapy interventions to improve her balance, endurance, and general lower extremity strength in order to decrease her risk of falls as well as to improve her mobility related quality of life.  OBJECTIVE IMPAIRMENTS: Abnormal gait, decreased activity tolerance, decreased balance, decreased endurance, decreased mobility, difficulty walking, decreased strength, and pain.   ACTIVITY LIMITATIONS: carrying, lifting, bending, sitting, standing, squatting, stairs, transfers, and locomotion level  PARTICIPATION LIMITATIONS: meal prep, cleaning, laundry, driving, shopping, community activity, and yard work  PERSONAL FACTORS: Age, Fitness, Past/current experiences, Time since onset of injury/illness/exacerbation, and 3+ comorbidities: HTN, CVA, Afib, DJD, R knee pain are also affecting patient's functional outcome.   REHAB POTENTIAL: Good  CLINICAL DECISION MAKING: Evolving/moderate complexity  EVALUATION COMPLEXITY: Moderate  PLAN:  PT FREQUENCY: 2x/week  PT DURATION: 12 weeks  PLANNED INTERVENTIONS: 97750- Physical Performance Testing, 97110-Therapeutic exercises, 97530- Therapeutic activity, 97112- Neuromuscular re-education, 97535- Self Care, [id number]- Manual therapy, [phone]- Gait  training, Balance training, Stair training, Dry Needling, Joint mobilization, Joint manipulation, Spinal manipulation, Spinal mobilization, Vestibular training, Cryotherapy, and Moist heat  PLAN FOR NEXT SESSION:     Note: Portions of this document were prepared using Dragon voice recognition software and although reviewed may contain unintentional dictation errors in syntax, grammar, or spelling.  Edwina Gram PT ,DPT Physical Therapist- [facility]   11/23/23, 10:41 AM

## 2023-11-22 ENCOUNTER — Ambulatory Visit: Admitting: Speech Pathology

## 2023-11-22 ENCOUNTER — Ambulatory Visit: Admitting: Physical Therapy

## 2023-11-22 DIAGNOSIS — R262 Difficulty in walking, not elsewhere classified: Secondary | ICD-10-CM

## 2023-11-22 DIAGNOSIS — R41841 Cognitive communication deficit: Secondary | ICD-10-CM | POA: Diagnosis not present

## 2023-11-22 DIAGNOSIS — R2689 Other abnormalities of gait and mobility: Secondary | ICD-10-CM

## 2023-11-22 DIAGNOSIS — M6281 Muscle weakness (generalized): Secondary | ICD-10-CM

## 2023-11-22 NOTE — Therapy (Unsigned)
 OUTPATIENT SPEECH LANGUAGE PATHOLOGY  TREATMENT NOTE   Patient Name: Pamela Garner MRN: [id number] DOB:[birth year], 83 y.o., female Today's Date: 11/22/2023  PCP: Burman Carrie, MD REFERRING PROVIDER: Devora Folks, MD   End of Session - 11/22/23 1529     Visit Number 2    Number of Visits 25    Date for SLP Re-Evaluation 02/05/24    Authorization Type [employer]    Progress Note Due on Visit 10    SLP Start Time 1530    SLP Stop Time  1615    SLP Time Calculation (min) 45 min    Activity Tolerance Patient tolerated treatment well             Past Medical History:  Diagnosis Date   Degenerative joint disease    Dermatophytosis, nail    feet   Hyperlipidemia    Hypertension    Lichen sclerosus    Thyroid disease    Trigeminal neuralgia of left side of face    Past Surgical History:  Procedure Laterality Date   ATRIAL FIBRILLATION ABLATION N/A 09/20/2023   Procedure: ATRIAL FIBRILLATION ABLATION;  Surgeon: Ardeen Kohler, MD;  Location: [facility];  Service: Cardiovascular;  Laterality: N/A;   COLONOSCOPY  2008   DILATION AND CURETTAGE OF UTERUS  1985   endometrial polyps   Patient Active Problem List   Diagnosis Date Noted   New onset atrial flutter (HCC) 06/13/2023   Diplopia 06/12/2023   CVA (cerebral vascular accident) (HCC) 06/12/2023   Atrial fibrillation (HCC) 06/12/2023   GERD (gastroesophageal reflux disease) 10/14/2019   RBBB (right bundle branch block) 10/14/2019   Lichen sclerosus et atrophicus of the vulva 06/13/2017   Thyroid disease    Hypertension    Hyperlipidemia    Degenerative joint disease     ONSET DATE: 06/12/2023; date of referral  11/06/2023  REFERRING DIAG: I63.9 (ICD-10-CM) - CVA (cerebral vascular accident) (HCC)   THERAPY DIAG:  Cognitive communication deficit  Rationale for Evaluation and Treatment Rehabilitation  SUBJECTIVE:   PERTINENT HISTORY: Pt is a 83 year old female with medical  history of atrial fibrillation, hypertension, hyperlipidemia, potential hearing loss, snoring and CVA (06/07/2023).  MRI revealed 06/12/2023 FINDINGS: Brain: Restricted diffusion involving the right occipital parietal cortex. A moderate area is affected. Chronic small vessel ischemia in the cerebral white matter and pons which is mild for age. Small chronic left cerebellar infarct.    PAIN:  Are you having pain? No   FALLS: Has patient fallen in last 6 months?  See PT evaluation for details  LIVING ENVIRONMENT: Lives with: lives with their spouse Lives in: House/apartment  PLOF:  Level of assistance: Independent with ADLs, Independent with IADLs Employment: Retired   PATIENT GOALS   continue to maintain and/or improve cognitive communication abilities  SUBJECTIVE STATEMENT: Pt reports that she lost her license Pt accompanied by: self  OBJECTIVE:   TODAY'S TREATMENT:  Skilled treatment session focused on pt's cognitive communication goals. SLP facilitated session by providing the following interventions:  Reviewed pt's recent testing and scores. She states that she has an appt with  Audiology but using a paper and pencil calendar that she leaves at home so therefore she is not able to recall time and date of appt. Pt is not interested in utilizing another form of calendar at this time. Education provided on benefits of hearing aids in cognitive linguistic perseveration.   Pt continues to participate in multiple social activities such as [employer], weekly card games, moose lodge.    PATIENT REPORTED OUTCOME MEASURES (PROM): To be completed over the next 3 sessions    PATIENT EDUCATION: Education details: ST services, nature of referral, ST POC Person educated: Patient Education method: Explanation Education comprehension: needs further education   HOME EXERCISE PROGRAM:   TBD   GOALS:  Goals reviewed with patient? Yes  SHORT TERM GOALS: Target date: 10  sessions  With Mod I, pt will demo knowledge that he needs to use practical compensation in a situation which has been difficult in the past, x2 sessions Baseline: Goal status: INITIAL   LONG TERM GOALS: Target date: 02/05/2024  With Mod I, patient will use strategies to improve memory for important information with 90% acc. Independently (ie., white board, daily planner/calendar, Apps on phone).   Baseline:  Goal status: INITIAL  2.  With Mod I, patient will demonstrate understanding of functional cognitive activities by listing 2 activities for home maintenance program. Baseline:  Goal status: INITIAL  3.  Pt will report improved cognitive communication via PROM by 5 points at last ST session   Baseline:  Goal status: INITIAL   ASSESSMENT:  CLINICAL IMPRESSION: Patient is a 83 y.o. female who was seen today for a cognitive communication treatment d/t recent CVA. Pt presents with very mild cognitive impairment but difficult to assess functional impact as caregiver not present and pt tends to respond to suggestions with laughter.   OBJECTIVE IMPAIRMENTS include memory and executive functioning. These impairments are limiting patient from managing appointments. Factors affecting potential to achieve goals and functional outcome are ability to learn/carryover information. Patient will benefit from skilled SLP services to address above impairments and improve overall function.  REHAB POTENTIAL: Excellent  PLAN: SLP FREQUENCY: 1-2x/week  SLP DURATION: 8 weeks  PLANNED INTERVENTIONS: Internal/external aids, Functional tasks, SLP instruction and feedback, Compensatory strategies, and Patient/family education   Khushboo Chuck B. Garlin Junker, M.S., CCC-SLP, Tree surgeon Certified Brain Injury Specialist [facility]  [facility] Rehabilitation Services Office [phone] Ascom [phone] Fax [phone]

## 2023-11-26 ENCOUNTER — Ambulatory Visit: Admitting: Physical Therapy

## 2023-11-26 ENCOUNTER — Ambulatory Visit: Attending: Neurology | Admitting: Speech Pathology

## 2023-11-26 DIAGNOSIS — G8929 Other chronic pain: Secondary | ICD-10-CM | POA: Insufficient documentation

## 2023-11-26 DIAGNOSIS — R2689 Other abnormalities of gait and mobility: Secondary | ICD-10-CM | POA: Diagnosis present

## 2023-11-26 DIAGNOSIS — R262 Difficulty in walking, not elsewhere classified: Secondary | ICD-10-CM | POA: Insufficient documentation

## 2023-11-26 DIAGNOSIS — M6281 Muscle weakness (generalized): Secondary | ICD-10-CM

## 2023-11-26 DIAGNOSIS — M25561 Pain in right knee: Secondary | ICD-10-CM | POA: Diagnosis present

## 2023-11-26 DIAGNOSIS — R41841 Cognitive communication deficit: Secondary | ICD-10-CM | POA: Diagnosis present

## 2023-11-26 NOTE — Therapy (Signed)
 OUTPATIENT PHYSICAL THERAPY NEURO TREATMENT   Patient Name: Pamela Garner MRN: [id number] DOB:[birth year], 83 y.o., female Today's Date: 11/26/2023   PCP: Melchor Spoon, MD  REFERRING PROVIDER: Melchor Spoon, MD   END OF SESSION:  PT End of Session - 11/26/23 1621     Visit Number 3    Number of Visits 25    Date for PT Re-Evaluation 02/05/24    Progress Note Due on Visit 10    PT Start Time 1615    PT Stop Time 1654    PT Time Calculation (min) 39 min    Equipment Utilized During Treatment Gait belt    Activity Tolerance Patient tolerated treatment well               Past Medical History:  Diagnosis Date   Degenerative joint disease    Dermatophytosis, nail    feet   Hyperlipidemia    Hypertension    Lichen sclerosus    Thyroid disease    Trigeminal neuralgia of left side of face    Past Surgical History:  Procedure Laterality Date   ATRIAL FIBRILLATION ABLATION N/A 09/20/2023   Procedure: ATRIAL FIBRILLATION ABLATION;  Surgeon: Ardeen Kohler, MD;  Location: [facility];  Service: Cardiovascular;  Laterality: N/A;   COLONOSCOPY  2008   DILATION AND CURETTAGE OF UTERUS  1985   endometrial polyps   Patient Active Problem List   Diagnosis Date Noted   New onset atrial flutter (HCC) 06/13/2023   Diplopia 06/12/2023   CVA (cerebral vascular accident) (HCC) 06/12/2023   Atrial fibrillation (HCC) 06/12/2023   GERD (gastroesophageal reflux disease) 10/14/2019   RBBB (right bundle branch block) 10/14/2019   Lichen sclerosus et atrophicus of the vulva 06/13/2017   Thyroid disease    Hypertension    Hyperlipidemia    Degenerative joint disease     ONSET DATE: 06/10/23  REFERRING DIAG: I63.9 (ICD-10-CM) - CVA (cerebral vascular accident) (HCC)  THERAPY DIAG:  Difficulty in walking, not elsewhere classified  Muscle weakness (generalized)  Imbalance  Chronic pain of right knee  Rationale for Evaluation and Treatment:  Rehabilitation  SUBJECTIVE:                                                                                                                                                                                             SUBJECTIVE STATEMENT:  Pt reports some back pain after last session. Had suggestion to walk for less time at once this session, PT explains we perforemd a test last session that required 6 min of continuous walking to test endurance.  Pt accompanied by: self  PERTINENT HISTORY: medical history significant of obesity, hypertension, hyperlipidemia, thyroid disease presenting with CVA and new onset atrial fibrillation.   PAIN:  Are you having pain? Yes: NPRS scale: 5/10 Pain location: R Knee Pain description: achey Aggravating factors: Standing for prolonged periods Relieving factors: Arthritis cream and gels  PRECAUTIONS: Fall  RED FLAGS: None   WEIGHT BEARING RESTRICTIONS: No  FALLS: Has patient fallen in last 6 months? Yes. Number of falls 3  LIVING ENVIRONMENT: Lives with: lives with their spouse Lives in: House/apartment Stairs: Yes: External: 3 steps; on right going up Has following equipment at home: Single point cane and Walker - 2 wheeled  PLOF: Independent  PATIENT GOALS: Pt is most concerned with the R knee and working it too much.   OBJECTIVE: Note: Objective measures were completed at Evaluation unless otherwise noted.  DIAGNOSTIC FINDINGS:   EXAM: MRI HEAD WITHOUT CONTRAST  IMPRESSION: Moderate size acute infarct in the right occipital parietal cortex.   COGNITION: Overall cognitive status: Within functional limits for tasks assessed   SENSATION: WFL  COORDINATION: WFL    LOWER EXTREMITY ROM:     Active  Right Eval Left Eval  Hip flexion    Hip extension    Hip abduction    Hip adduction    Hip internal rotation    Hip external rotation    Knee flexion    Knee extension    Ankle dorsiflexion    Ankle plantarflexion     Ankle inversion    Ankle eversion     (Blank rows = not tested)  LOWER EXTREMITY MMT:    MMT Right Eval Left Eval  Hip flexion    Hip extension    Hip abduction    Hip adduction    Hip internal rotation    Hip external rotation    Knee flexion    Knee extension    Ankle dorsiflexion    Ankle plantarflexion    Ankle inversion    Ankle eversion    (Blank rows = not tested)  BED MOBILITY:  Not tested  TRANSFERS: Sit to stand: Modified independence  Assistive device utilized: None     Stand to sit: Modified independence  Assistive device utilized: None     Chair to chair: Modified independence  Assistive device utilized: None       STAIRS: Findings: Level of Assistance: Modified independence, Psychologist, counselling Technique: Step to [employer] Forwards With use of AD: cane but not utilized at all with Single Rail on Right, Number of Stairs: 4, Height of Stairs: 6"   , and Comments: Pt descends stairs backwards due to reduced pain when performing this way.  FUNCTIONAL TESTS:  5 times sit to stand: 24.89 sec with UE push up from arm rests Timed up and go (TUG): 26.98 with no AD 6 minute walk test: TBD at next visit 10 meter walk test: 29.08 with no AD, 20.65 with cane Berg Balance Scale: TBD at next visit  PATIENT SURVEYS:  ABC scale 36.25% LEFS 25/80  TREATMENT DATE: 11/26/23  TE- To improve strength, endurance, mobility, and function of specific targeted muscle groups or improve joint range of motion or improve muscle flexibility  Nustep level 1 B UE and LE reciprocal movement training x 6 min, c/o knee and back pain generally but unable to explain reasons for any exacerbation   - Seated Long Arc Quad  - 2 sets - 10 reps - 1 sec hold - Seated March  - 2 sets - 10 reps - Sit to Stand  - 2 sets - 8 reps  NMR  Adducted stance on airex  pad x 45 sec  1 LE airex, 1 LE on step 2 x 30 sec ea LE   TA:   Gait using transport chair as " shopping cart" x 325 ft   Self care:   Instructed pt and provided handout instructing in the benefits of light and specific exercises for OA as pt seemed to still believe that exercise would degenerate her joints further.   PATIENT EDUCATION: Education details: Pt educated on role of PT and services provided during current POC, along with prognosis and information about the clinic. Person educated: Patient Education method: Explanation Education comprehension: verbalized understanding  HOME EXERCISE PROGRAM:  Access Code: [phone] URL: [email or web address] Date: 11/23/2023 Prepared by: Marlynn Singer  Exercises - Seated Long Arc Quad  - 1 x daily - 7 x weekly - 2 sets - 10 reps - 1 sec hold - Seated March  - 1 x daily - 7 x weekly - 2 sets - 10 reps - Sit to Stand  - 1 x daily - 7 x weekly - 3 x 5  GOALS: Goals reviewed with patient? Yes  SHORT TERM GOALS: Target date: 12/11/2023  Pt will be independent with HEP in order to demonstrate increased ability to perform tasks related to occupation/hobbies. Baseline:  Pt to be given HEP at the next visit. Goal status: INITIAL   LONG TERM GOALS: Target date: 02/05/2024  1.  Patient (> 75 years old) will complete five times sit to stand test in < 15 seconds indicating an increased LE strength and improved balance. Baseline: 24.89 sec Goal status: INITIAL  2.  Patient will improve the LEFS by 11% in order to demonstrate functional independence without pain. Baseline: 20/80; 25%  Goal status: INITIAL   3.  Patient will increase Berg Balance score by > 6 points to demonstrate decreased fall risk during functional activities. Baseline: 42 Goal status: INITIAL   3.  Patient will reduce timed up and go to <11 seconds to reduce fall risk and demonstrate improved transfer/gait ability. Baseline: 26.98 sec with no AD Goal  status: INITIAL  4.  Patient will increase 10 meter walk test to >1.56m/s as to improve gait speed for better community ambulation and to reduce fall risk. Baseline: 29.08 with no AD; 20.65 with cane Goal status: INITIAL  5.  Patient will increase six minute walk test distance to >1000 for progression to community ambulator and improve gait ability Baseline: 465 ft with SPC  Goal status: INITIAL   ASSESSMENT:  CLINICAL IMPRESSION:  Patient arrived with good motivation for completion of pt activities. Pt educated regarding importance of exercise for joint health. Pt progressed with activities to improve her strength and function with overall good response. Pt will continue to benefit from skilled physical therapy intervention to address impairments, improve QOL, and attain therapy goals.      OBJECTIVE IMPAIRMENTS: Abnormal gait, decreased activity tolerance, decreased balance, decreased  endurance, decreased mobility, difficulty walking, decreased strength, and pain.   ACTIVITY LIMITATIONS: carrying, lifting, bending, sitting, standing, squatting, stairs, transfers, and locomotion level  PARTICIPATION LIMITATIONS: meal prep, cleaning, laundry, driving, shopping, community activity, and yard work  PERSONAL FACTORS: Age, Fitness, Past/current experiences, Time since onset of injury/illness/exacerbation, and 3+ comorbidities: HTN, CVA, Afib, DJD, R knee pain are also affecting patient's functional outcome.   REHAB POTENTIAL: Good  CLINICAL DECISION MAKING: Evolving/moderate complexity  EVALUATION COMPLEXITY: Moderate  PLAN:  PT FREQUENCY: 2x/week  PT DURATION: 12 weeks  PLANNED INTERVENTIONS: 97750- Physical Performance Testing, 97110-Therapeutic exercises, 97530- Therapeutic activity, 97112- Neuromuscular re-education, 97535- Self Care, 40981- Manual therapy, [phone]- Gait training, Balance training, Stair training, Dry Needling, Joint mobilization, Joint manipulation, Spinal  manipulation, Spinal mobilization, Vestibular training, Cryotherapy, and Moist heat  PLAN FOR NEXT SESSION:   Note: Portions of this document were prepared using Dragon voice recognition software and although reviewed may contain unintentional dictation errors in syntax, grammar, or spelling.  Edwina Gram PT ,DPT Physical Therapist- [facility]   11/26/23, 4:23 PM

## 2023-11-26 NOTE — Therapy (Signed)
 OUTPATIENT SPEECH LANGUAGE PATHOLOGY  TREATMENT NOTE   Patient Name: Pamela Garner MRN: [id number] DOB:[birth year], 83 y.o., female Today's Date: 11/26/2023  PCP: Burman Carrie, MD REFERRING PROVIDER: Devora Folks, MD   End of Session - 11/26/23 1543     Visit Number 3    Number of Visits 25    Date for SLP Re-Evaluation 02/05/24    Authorization Type [employer]    Authorization Time Period 11/13/2023 thru 01/08/2024    Authorization - Visit Number 3    Authorization - Number of Visits 16    Progress Note Due on Visit 10    SLP Start Time 1540    SLP Stop Time  1620    SLP Time Calculation (min) 40 min    Activity Tolerance Patient tolerated treatment well             Past Medical History:  Diagnosis Date   Degenerative joint disease    Dermatophytosis, nail    feet   Hyperlipidemia    Hypertension    Lichen sclerosus    Thyroid disease    Trigeminal neuralgia of left side of face    Past Surgical History:  Procedure Laterality Date   ATRIAL FIBRILLATION ABLATION N/A 09/20/2023   Procedure: ATRIAL FIBRILLATION ABLATION;  Surgeon: Ardeen Kohler, MD;  Location: [facility];  Service: Cardiovascular;  Laterality: N/A;   COLONOSCOPY  2008   DILATION AND CURETTAGE OF UTERUS  1985   endometrial polyps   Patient Active Problem List   Diagnosis Date Noted   New onset atrial flutter (HCC) 06/13/2023   Diplopia 06/12/2023   CVA (cerebral vascular accident) (HCC) 06/12/2023   Atrial fibrillation (HCC) 06/12/2023   GERD (gastroesophageal reflux disease) 10/14/2019   RBBB (right bundle branch block) 10/14/2019   Lichen sclerosus et atrophicus of the vulva 06/13/2017   Thyroid disease    Hypertension    Hyperlipidemia    Degenerative joint disease     ONSET DATE: 06/12/2023; date of referral  11/06/2023  REFERRING DIAG: I63.9 (ICD-10-CM) - CVA (cerebral vascular accident) (HCC)   THERAPY DIAG:  Cognitive communication  deficit  Rationale for Evaluation and Treatment Rehabilitation  SUBJECTIVE:   PERTINENT HISTORY: Pt is a 83 year old female with medical history of atrial fibrillation, hypertension, hyperlipidemia, potential hearing loss, snoring and CVA (06/07/2023).  MRI revealed 06/12/2023 FINDINGS: Brain: Restricted diffusion involving the right occipital parietal cortex. A moderate area is affected. Chronic small vessel ischemia in the cerebral white matter and pons which is mild for age. Small chronic left cerebellar infarct.    PAIN:  Are you having pain? No   FALLS: Has patient fallen in last 6 months?  See PT evaluation for details  LIVING ENVIRONMENT: Lives with: lives with their spouse Lives in: House/apartment  PLOF:  Level of assistance: Independent with ADLs, Independent with IADLs Employment: Retired   PATIENT GOALS   continue to maintain and/or improve cognitive communication abilities  SUBJECTIVE STATEMENT: Pt late for today's appt  - she reports getting behind a trash truck on her way over to appt Pt accompanied by: self  OBJECTIVE:   TODAY'S TREATMENT:  Skilled treatment session focused on pt's cognitive communication goals. SLP facilitated session by providing the following interventions:  Pt states that her husband found her driver's license in her car (pt reports that it likely fell out of her purse that she doesn't zip her purse).   Pt continues to describe social community events that she  has recently re-started following CVA. She doesn't describe any deficits that she has noticed at this time.     PATIENT REPORTED OUTCOME MEASURES (PROM): To be completed over the next 3 sessions    PATIENT EDUCATION: Education details: ST services, nature of referral, ST POC Person educated: Patient Education method: Explanation Education comprehension: needs further education   HOME EXERCISE PROGRAM:   TBD   GOALS:  Goals reviewed with patient? Yes  SHORT  TERM GOALS: Target date: 10 sessions  With Mod I, pt will demo knowledge that he needs to use practical compensation in a situation which has been difficult in the past, x2 sessions Baseline: Goal status: INITIAL   LONG TERM GOALS: Target date: 02/05/2024  With Mod I, patient will use strategies to improve memory for important information with 90% acc. Independently (ie., white board, daily planner/calendar, Apps on phone).   Baseline:  Goal status: INITIAL  2.  With Mod I, patient will demonstrate understanding of functional cognitive activities by listing 2 activities for home maintenance program. Baseline:  Goal status: INITIAL  3.  Pt will report improved cognitive communication via PROM by 5 points at last ST session   Baseline:  Goal status: INITIAL   ASSESSMENT:  CLINICAL IMPRESSION: Patient is a 83 y.o. female who was seen today for a cognitive communication treatment d/t recent CVA. Pt presents with very mild cognitive impairment but difficult to assess functional impact as caregiver not present and pt tends to respond to suggestions with laughter.   OBJECTIVE IMPAIRMENTS include memory and executive functioning. These impairments are limiting patient from managing appointments. Factors affecting potential to achieve goals and functional outcome are ability to learn/carryover information. Patient will benefit from skilled SLP services to address above impairments and improve overall function.  REHAB POTENTIAL: Excellent  PLAN: SLP FREQUENCY: 1-2x/week  SLP DURATION: 8 weeks  PLANNED INTERVENTIONS: Internal/external aids, Functional tasks, SLP instruction and feedback, Compensatory strategies, and Patient/family education   Jiana Lemaire B. Garlin Junker, M.S., CCC-SLP, Tree surgeon Certified Brain Injury Specialist [facility] [place]  [facility] Rehabilitation Services Office [phone] Ascom [phone] Fax [phone]

## 2023-11-27 ENCOUNTER — Telehealth: Payer: Self-pay | Admitting: Speech Pathology

## 2023-11-29 ENCOUNTER — Ambulatory Visit: Admitting: Physical Therapy

## 2023-11-29 ENCOUNTER — Ambulatory Visit: Admitting: Speech Pathology

## 2023-11-30 ENCOUNTER — Ambulatory Visit: Admitting: Physical Therapy

## 2023-11-30 ENCOUNTER — Encounter: Payer: Self-pay | Admitting: Physical Therapy

## 2023-11-30 ENCOUNTER — Ambulatory Visit: Admitting: Speech Pathology

## 2023-11-30 DIAGNOSIS — G8929 Other chronic pain: Secondary | ICD-10-CM

## 2023-11-30 DIAGNOSIS — M6281 Muscle weakness (generalized): Secondary | ICD-10-CM

## 2023-11-30 DIAGNOSIS — R2689 Other abnormalities of gait and mobility: Secondary | ICD-10-CM

## 2023-11-30 DIAGNOSIS — R262 Difficulty in walking, not elsewhere classified: Secondary | ICD-10-CM

## 2023-11-30 DIAGNOSIS — R41841 Cognitive communication deficit: Secondary | ICD-10-CM | POA: Diagnosis not present

## 2023-11-30 NOTE — Therapy (Signed)
 OUTPATIENT SPEECH LANGUAGE PATHOLOGY  TREATMENT NOTE DISCHARGE SUMMARY   Patient Name: Pamela Garner MRN: [id number] DOB:05-Jun-1941, 83 y.o., female Today's Date: 11/30/2023  PCP: Burman Carrie, MD REFERRING PROVIDER: Devora Folks, MD   End of Session - 11/30/23 1043     Visit Number 4    Number of Visits 25    Date for SLP Re-Evaluation 02/05/24    Authorization Type [employer]    Authorization Time Period 11/13/2023 thru 01/08/2024    Authorization - Visit Number 4    Authorization - Number of Visits 16    Progress Note Due on Visit 10    SLP Start Time 1016    SLP Stop Time  1045    SLP Time Calculation (min) 29 min    Activity Tolerance Patient tolerated treatment well              Past Medical History:  Diagnosis Date   Degenerative joint disease    Dermatophytosis, nail    feet   Hyperlipidemia    Hypertension    Lichen sclerosus    Thyroid disease    Trigeminal neuralgia of left side of face    Past Surgical History:  Procedure Laterality Date   ATRIAL FIBRILLATION ABLATION N/A 09/20/2023   Procedure: ATRIAL FIBRILLATION ABLATION;  Surgeon: Ardeen Kohler, MD;  Location: [facility];  Service: Cardiovascular;  Laterality: N/A;   COLONOSCOPY  2008   DILATION AND CURETTAGE OF UTERUS  1985   endometrial polyps   Patient Active Problem List   Diagnosis Date Noted   New onset atrial flutter (HCC) 06/13/2023   Diplopia 06/12/2023   CVA (cerebral vascular accident) (HCC) 06/12/2023   Atrial fibrillation (HCC) 06/12/2023   GERD (gastroesophageal reflux disease) 10/14/2019   RBBB (right bundle branch block) 10/14/2019   Lichen sclerosus et atrophicus of the vulva 06/13/2017   Thyroid disease    Hypertension    Hyperlipidemia    Degenerative joint disease     ONSET DATE: 06/12/2023; date of referral  11/06/2023  REFERRING DIAG: I63.9 (ICD-10-CM) - CVA (cerebral vascular accident) (HCC)   THERAPY DIAG:  Cognitive  communication deficit  Rationale for Evaluation and Treatment Rehabilitation  SUBJECTIVE:   PERTINENT HISTORY: Pt is a 83 year old female with medical history of atrial fibrillation, hypertension, hyperlipidemia, potential hearing loss, snoring and CVA (06/07/2023).  MRI revealed 06/12/2023 FINDINGS: Brain: Restricted diffusion involving the right occipital parietal cortex. A moderate area is affected. Chronic small vessel ischemia in the cerebral white matter and pons which is mild for age. Small chronic left cerebellar infarct.    PAIN:  Are you having pain? No   FALLS: Has patient fallen in last 6 months?  See PT evaluation for details  LIVING ENVIRONMENT: Lives with: lives with their spouse Lives in: House/apartment  PLOF:  Level of assistance: Independent with ADLs, Independent with IADLs Employment: Retired   PATIENT GOALS   continue to maintain and/or improve cognitive communication abilities  SUBJECTIVE STATEMENT: "We had some excitement in my house yesterday, my husband was T-Boned yesterday." Pt accompanied by: self  OBJECTIVE:   TODAY'S TREATMENT:  Skilled treatment session focused on pt's cognitive communication goals. SLP facilitated session by providing the following interventions:  Pt reports that she had audiological examination performed and while "I know that I need hearing aids, it is such a scam." She reports pleasant resistance to getting hearing aids but voices understanding of benefits.    Continued education provided on use  of compensatory memory aides to promote habitual use. Pt continues to have pleasant response but is self-selecting in routines/habits.    PATIENT EDUCATION: Education details: ST services, nature of referral, ST POC Person educated: Patient Education method: Explanation Education comprehension: needs further education   HOME EXERCISE PROGRAM:   TBD   GOALS:  Goals reviewed with patient? Yes  SHORT TERM GOALS:  Target date: 10 sessions  With Mod I, pt will demo knowledge that he needs to use practical compensation in a situation which has been difficult in the past, x2 sessions Baseline: Goal status: INITIAL: MET   LONG TERM GOALS: Target date: 02/05/2024  With Mod I, patient will use strategies to improve memory for important information with 90% acc. Independently (ie., white board, daily planner/calendar, Apps on phone).   Baseline:  Goal status: INITIAL: MET pt voices understanding and self-selects aids/strategies  2.  With Mod I, patient will demonstrate understanding of functional cognitive activities by listing 2 activities for home maintenance program. Baseline:  Goal status: INITIAL: MET  3.  Pt will report improved cognitive communication via PROM by 5 points at last ST session   Baseline:  Goal status: INITIAL: MET pt reports great improvement and return to social activities and independence following recent CVA   ASSESSMENT:  CLINICAL IMPRESSION: Patient is a 83 y.o. female who was seen today for a cognitive communication treatment d/t recent CVA.   Pt has demonstrated understanding of concepts and reports improved function, independence and return to social activities. At this time, all education has been completed with pt meeting the maximal benefit from skilled ST services.    PLAN:  Pt has met maximal benefit from skilled services.    Rahul Malinak B. Garlin Junker, M.S., CCC-SLP, Tree surgeon Certified Brain Injury Specialist [facility]  [facility] Rehabilitation Services Office [phone] Ascom [phone] Fax [phone]

## 2023-11-30 NOTE — Telephone Encounter (Signed)
 Pt called and requested that her therapy appts be rescheduled d/t conflict with upcoming audiology appt.   Jenan Ellegood B. Garlin Junker, M.S., CCC-SLP, Tree surgeon Certified Brain Injury Specialist [facility]  [facility] Rehabilitation Services Office [phone] Ascom [phone] Fax [phone]

## 2023-11-30 NOTE — Therapy (Signed)
 OUTPATIENT PHYSICAL THERAPY NEURO TREATMENT   Patient Name: Pamela Garner MRN: [id number] DOB:January 15, 1941, 83 y.o., female Today's Date: 11/30/2023   PCP: Melchor Spoon, MD  REFERRING PROVIDER: Melchor Spoon, MD   END OF SESSION:  PT End of Session - 11/30/23 1019     Visit Number 4    Number of Visits 25    Date for PT Re-Evaluation 02/05/24    Progress Note Due on Visit 10    PT Start Time 0933    PT Stop Time 1016    PT Time Calculation (min) 43 min    Equipment Utilized During Treatment Gait belt    Activity Tolerance Patient tolerated treatment well    Behavior During Therapy WFL for tasks assessed/performed                Past Medical History:  Diagnosis Date   Degenerative joint disease    Dermatophytosis, nail    feet   Hyperlipidemia    Hypertension    Lichen sclerosus    Thyroid disease    Trigeminal neuralgia of left side of face    Past Surgical History:  Procedure Laterality Date   ATRIAL FIBRILLATION ABLATION N/A 09/20/2023   Procedure: ATRIAL FIBRILLATION ABLATION;  Surgeon: Ardeen Kohler, MD;  Location: [facility];  Service: Cardiovascular;  Laterality: N/A;   COLONOSCOPY  2008   DILATION AND CURETTAGE OF UTERUS  1985   endometrial polyps   Patient Active Problem List   Diagnosis Date Noted   New onset atrial flutter (HCC) 06/13/2023   Diplopia 06/12/2023   CVA (cerebral vascular accident) (HCC) 06/12/2023   Atrial fibrillation (HCC) 06/12/2023   GERD (gastroesophageal reflux disease) 10/14/2019   RBBB (right bundle branch block) 10/14/2019   Lichen sclerosus et atrophicus of the vulva 06/13/2017   Thyroid disease    Hypertension    Hyperlipidemia    Degenerative joint disease     ONSET DATE: 06/10/23  REFERRING DIAG: I63.9 (ICD-10-CM) - CVA (cerebral vascular accident) (HCC)  THERAPY DIAG:  Difficulty in walking, not elsewhere classified  Muscle weakness (generalized)  Imbalance  Chronic pain of right  knee  Rationale for Evaluation and Treatment: Rehabilitation  SUBJECTIVE:                                                                                                                                                                                             SUBJECTIVE STATEMENT:  Pt reports no pain increase after last session, feels like it is helping. Her husband had a bad car accident this week and he seems to be okay.  Pt accompanied by: self  PERTINENT HISTORY: medical history significant of obesity, hypertension, hyperlipidemia, thyroid disease presenting with CVA and new onset atrial fibrillation.   PAIN:  Are you having pain? Yes: NPRS scale: 5/10 Pain location: R Knee Pain description: achey Aggravating factors: Standing for prolonged periods Relieving factors: Arthritis cream and gels  PRECAUTIONS: Fall  RED FLAGS: None   WEIGHT BEARING RESTRICTIONS: No  FALLS: Has patient fallen in last 6 months? Yes. Number of falls 3  LIVING ENVIRONMENT: Lives with: lives with their spouse Lives in: House/apartment Stairs: Yes: External: 3 steps; on right going up Has following equipment at home: Single point cane and Walker - 2 wheeled  PLOF: Independent  PATIENT GOALS: Pt is most concerned with the R knee and working it too much.   OBJECTIVE: Note: Objective measures were completed at Evaluation unless otherwise noted.  DIAGNOSTIC FINDINGS:   EXAM: MRI HEAD WITHOUT CONTRAST  IMPRESSION: Moderate size acute infarct in the right occipital parietal cortex.   COGNITION: Overall cognitive status: Within functional limits for tasks assessed   SENSATION: WFL  COORDINATION: WFL    LOWER EXTREMITY ROM:     Active  Right Eval Left Eval  Hip flexion    Hip extension    Hip abduction    Hip adduction    Hip internal rotation    Hip external rotation    Knee flexion    Knee extension    Ankle dorsiflexion    Ankle plantarflexion    Ankle inversion     Ankle eversion     (Blank rows = not tested)  LOWER EXTREMITY MMT:    MMT Right Eval Left Eval  Hip flexion    Hip extension    Hip abduction    Hip adduction    Hip internal rotation    Hip external rotation    Knee flexion    Knee extension    Ankle dorsiflexion    Ankle plantarflexion    Ankle inversion    Ankle eversion    (Blank rows = not tested)  BED MOBILITY:  Not tested  TRANSFERS: Sit to stand: Modified independence  Assistive device utilized: None     Stand to sit: Modified independence  Assistive device utilized: None     Chair to chair: Modified independence  Assistive device utilized: None       STAIRS: Findings: Level of Assistance: Modified independence, Psychologist, counselling Technique: Step to [employer] Forwards With use of AD: cane but not utilized at all with Single Rail on Right, Number of Stairs: 4, Height of Stairs: 6"   , and Comments: Pt descends stairs backwards due to reduced pain when performing this way.  FUNCTIONAL TESTS:  5 times sit to stand: 24.89 sec with UE push up from arm rests Timed up and go (TUG): 26.98 with no AD 6 minute walk test: TBD at next visit 10 meter walk test: 29.08 with no AD, 20.65 with cane Berg Balance Scale: TBD at next visit  PATIENT SURVEYS:  ABC scale 36.25% LEFS 25/80  TREATMENT DATE: 11/30/23  TE- To improve strength, endurance, mobility, and function of specific targeted muscle groups or improve joint range of motion or improve muscle flexibility  Nustep level 1 x 6 min B UE and LE reciprocal movement training x 6 min, c/o knee and back pain generally but unable to explain reasons for any exacerbation   TA- To improve functional movements patterns for everyday tasks   STS- attempted with 3KG ball but pt unable  Transitioned ot no ball but placed feet closer to chair and pt  completed without UE assist 2 x 8 reps   Sidestepping at support bar x 12 ea direction with UE support   -Banded sidestepping with RTB around pt knees 2*8 laps ea direction   - rates 6/10 difficulty   -instructed in the benefit of hip stability and strength exercises for improvement in knee pain   Gait using transport chair as " shopping cart" x 375 ft with 2# AW donned  Unless otherwise stated, CGA was provided and gait belt donned in order to ensure pt safety   PATIENT EDUCATION: Education details: Pt educated on role of PT and services provided during current POC, along with prognosis and information about the clinic. Person educated: Patient Education method: Explanation Education comprehension: verbalized understanding  HOME EXERCISE PROGRAM:  Access Code: [phone] URL: [email or web address] Date: 11/23/2023 Prepared by: Marlynn Singer  Exercises - Seated Long Arc Quad  - 1 x daily - 7 x weekly - 2 sets - 10 reps - 1 sec hold - Seated March  - 1 x daily - 7 x weekly - 2 sets - 10 reps - Sit to Stand  - 1 x daily - 7 x weekly - 3 x 5  GOALS: Goals reviewed with patient? Yes  SHORT TERM GOALS: Target date: 12/11/2023  Pt will be independent with HEP in order to demonstrate increased ability to perform tasks related to occupation/hobbies. Baseline:  Pt to be given HEP at the next visit. Goal status: INITIAL   LONG TERM GOALS: Target date: 02/05/2024  1.  Patient (> 48 years old) will complete five times sit to stand test in < 15 seconds indicating an increased LE strength and improved balance. Baseline: 24.89 sec Goal status: INITIAL  2.  Patient will improve the LEFS by 11% in order to demonstrate functional independence without pain. Baseline: 20/80; 25%  Goal status: INITIAL   3.  Patient will increase Berg Balance score by > 6 points to demonstrate decreased fall risk during functional activities. Baseline: 42 Goal status: INITIAL   3.   Patient will reduce timed up and go to <11 seconds to reduce fall risk and demonstrate improved transfer/gait ability. Baseline: 26.98 sec with no AD Goal status: INITIAL  4.  Patient will increase 10 meter walk test to >1.23m/s as to improve gait speed for better community ambulation and to reduce fall risk. Baseline: 29.08 with no AD; 20.65 with cane Goal status: INITIAL  5.  Patient will increase six minute walk test distance to >1000 for progression to community ambulator and improve gait ability Baseline: 465 ft with SPC  Goal status: INITIAL   ASSESSMENT:  CLINICAL IMPRESSION:  Patient arrived with good motivation for completion of pt activities. Pt treatment focussed on functional movement training including practice with transitions and with practice with various planes of movement. Pt did not complain of any pain this session in knee or back musculature. Instructed to continue with HEP when on vacation in order to  maintain and continue progress. Pt will continue to benefit from skilled physical therapy intervention to address impairments, improve QOL, and attain therapy goals.    OBJECTIVE IMPAIRMENTS: Abnormal gait, decreased activity tolerance, decreased balance, decreased endurance, decreased mobility, difficulty walking, decreased strength, and pain.   ACTIVITY LIMITATIONS: carrying, lifting, bending, sitting, standing, squatting, stairs, transfers, and locomotion level  PARTICIPATION LIMITATIONS: meal prep, cleaning, laundry, driving, shopping, community activity, and yard work  PERSONAL FACTORS: Age, Fitness, Past/current experiences, Time since onset of injury/illness/exacerbation, and 3+ comorbidities: HTN, CVA, Afib, DJD, R knee pain are also affecting patient's functional outcome.   REHAB POTENTIAL: Good  CLINICAL DECISION MAKING: Evolving/moderate complexity  EVALUATION COMPLEXITY: Moderate  PLAN:  PT FREQUENCY: 2x/week  PT DURATION: 12 weeks  PLANNED  INTERVENTIONS: 97750- Physical Performance Testing, 97110-Therapeutic exercises, 97530- Therapeutic activity, 97112- Neuromuscular re-education, 97535- Self Care, 16109- Manual therapy, [phone]- Gait training, Balance training, Stair training, Dry Needling, Joint mobilization, Joint manipulation, Spinal manipulation, Spinal mobilization, Vestibular training, Cryotherapy, and Moist heat  PLAN FOR NEXT SESSION:   Note: Portions of this document were prepared using Dragon voice recognition software and although reviewed may contain unintentional dictation errors in syntax, grammar, or spelling.  Edwina Gram PT ,DPT Physical Therapist- [facility]   11/30/23, 10:20 AM

## 2023-12-10 ENCOUNTER — Encounter: Admitting: Speech Pathology

## 2023-12-10 ENCOUNTER — Ambulatory Visit: Admitting: Physical Therapy

## 2023-12-10 DIAGNOSIS — R262 Difficulty in walking, not elsewhere classified: Secondary | ICD-10-CM

## 2023-12-10 DIAGNOSIS — G8929 Other chronic pain: Secondary | ICD-10-CM

## 2023-12-10 DIAGNOSIS — M6281 Muscle weakness (generalized): Secondary | ICD-10-CM

## 2023-12-10 DIAGNOSIS — R41841 Cognitive communication deficit: Secondary | ICD-10-CM | POA: Diagnosis not present

## 2023-12-10 DIAGNOSIS — R2689 Other abnormalities of gait and mobility: Secondary | ICD-10-CM

## 2023-12-10 NOTE — Therapy (Unsigned)
 OUTPATIENT PHYSICAL THERAPY NEURO TREATMENT   Patient Name: Pamela Garner MRN: [id number] DOB:07-Mar-1941, 83 y.o., female Today's Date: 12/10/2023   PCP: Melchor Spoon, MD  REFERRING PROVIDER: Melchor Spoon, MD   END OF SESSION:  PT End of Session - 12/10/23 1625     Visit Number 5    Number of Visits 25    Date for PT Re-Evaluation 02/05/24    Progress Note Due on Visit 10    PT Start Time 1619    PT Stop Time 1700    PT Time Calculation (min) 41 min    Equipment Utilized During Treatment Gait belt    Activity Tolerance Patient tolerated treatment well    Behavior During Therapy WFL for tasks assessed/performed             Past Medical History:  Diagnosis Date   Degenerative joint disease    Dermatophytosis, nail    feet   Hyperlipidemia    Hypertension    Lichen sclerosus    Thyroid disease    Trigeminal neuralgia of left side of face    Past Surgical History:  Procedure Laterality Date   ATRIAL FIBRILLATION ABLATION N/A 09/20/2023   Procedure: ATRIAL FIBRILLATION ABLATION;  Surgeon: Ardeen Kohler, MD;  Location: [facility];  Service: Cardiovascular;  Laterality: N/A;   COLONOSCOPY  2008   DILATION AND CURETTAGE OF UTERUS  1985   endometrial polyps   Patient Active Problem List   Diagnosis Date Noted   New onset atrial flutter (HCC) 06/13/2023   Diplopia 06/12/2023   CVA (cerebral vascular accident) (HCC) 06/12/2023   Atrial fibrillation (HCC) 06/12/2023   GERD (gastroesophageal reflux disease) 10/14/2019   RBBB (right bundle branch block) 10/14/2019   Lichen sclerosus et atrophicus of the vulva 06/13/2017   Thyroid disease    Hypertension    Hyperlipidemia    Degenerative joint disease     ONSET DATE: 06/10/23  REFERRING DIAG: I63.9 (ICD-10-CM) - CVA (cerebral vascular accident) (HCC)  THERAPY DIAG:  Difficulty in walking, not elsewhere classified  Muscle weakness (generalized)  Imbalance  Chronic pain of right  knee  Rationale for Evaluation and Treatment: Rehabilitation  SUBJECTIVE:                                                                                                                                                                                             SUBJECTIVE STATEMENT:  Pt reports no pain increase after last session, feels like it is helping. Her husband had a bad car accident this week and he seems to be okay.   Pt  accompanied by: self  PERTINENT HISTORY: medical history significant of obesity, hypertension, hyperlipidemia, thyroid disease presenting with CVA and new onset atrial fibrillation.   PAIN:  Are you having pain? Yes: NPRS scale: 5/10 Pain location: R Knee Pain description: achey Aggravating factors: Standing for prolonged periods Relieving factors: Arthritis cream and gels  PRECAUTIONS: Fall  RED FLAGS: None   WEIGHT BEARING RESTRICTIONS: No  FALLS: Has patient fallen in last 6 months? Yes. Number of falls 3  LIVING ENVIRONMENT: Lives with: lives with their spouse Lives in: House/apartment Stairs: Yes: External: 3 steps; on right going up Has following equipment at home: Single point cane and Walker - 2 wheeled  PLOF: Independent  PATIENT GOALS: Pt is most concerned with the R knee and working it too much.   OBJECTIVE: Note: Objective measures were completed at Evaluation unless otherwise noted.  DIAGNOSTIC FINDINGS:   EXAM: MRI HEAD WITHOUT CONTRAST  IMPRESSION: Moderate size acute infarct in the right occipital parietal cortex.   COGNITION: Overall cognitive status: Within functional limits for tasks assessed   SENSATION: WFL  COORDINATION: WFL    LOWER EXTREMITY ROM:     Active  Right Eval Left Eval  Hip flexion    Hip extension    Hip abduction    Hip adduction    Hip internal rotation    Hip external rotation    Knee flexion    Knee extension    Ankle dorsiflexion    Ankle plantarflexion    Ankle inversion     Ankle eversion     (Blank rows = not tested)  LOWER EXTREMITY MMT:    MMT Right Eval Left Eval  Hip flexion    Hip extension    Hip abduction    Hip adduction    Hip internal rotation    Hip external rotation    Knee flexion    Knee extension    Ankle dorsiflexion    Ankle plantarflexion    Ankle inversion    Ankle eversion    (Blank rows = not tested)  BED MOBILITY:  Not tested  TRANSFERS: Sit to stand: Modified independence  Assistive device utilized: None     Stand to sit: Modified independence  Assistive device utilized: None     Chair to chair: Modified independence  Assistive device utilized: None       STAIRS: Findings: Level of Assistance: Modified independence, Psychologist, counselling Technique: Step to [employer] Forwards With use of AD: cane but not utilized at all with Single Rail on Right, Number of Stairs: 4, Height of Stairs: 6   , and Comments: Pt descends stairs backwards due to reduced pain when performing this way.  FUNCTIONAL TESTS:  5 times sit to stand: 24.89 sec with UE push up from arm rests Timed up and go (TUG): 26.98 with no AD 6 minute walk test: TBD at next visit 10 meter walk test: 29.08 with no AD, 20.65 with cane Berg Balance Scale: TBD at next visit  PATIENT SURVEYS:  ABC scale 36.25% LEFS 25/80  TREATMENT DATE: 12/10/23  TE- To improve strength, endurance, mobility, and function of specific targeted muscle groups or improve joint range of motion or improve muscle flexibility  Nustep level 1 x 6 min B UE and LE reciprocal movement training x 6 min, c/o knee and back pain generally but unable to explain reasons for any exacerbation   TA- To improve functional movements patterns for everyday tasks   STS- 2 x 8 reps with 3KG ball, cues to use trunk flexion for ant weight shift    -Banded sidestepping  with RTB around pt knees 2*8 laps ea direction    Gait using transport chair as  shopping cart x 375 ft with 2# AW donned  Unless otherwise stated, CGA was provided and gait belt donned in order to ensure pt safety   PATIENT EDUCATION: Education details: Pt educated on role of PT and services provided during current POC, along with prognosis and information about the clinic. Person educated: Patient Education method: Explanation Education comprehension: verbalized understanding  HOME EXERCISE PROGRAM:  Access Code: [email or web address] URL: [email or web address] Date: 11/23/2023 Prepared by: Marlynn Singer  Exercises - Seated Long Arc Quad  - 1 x daily - 7 x weekly - 2 sets - 10 reps - 1 sec hold - Seated March  - 1 x daily - 7 x weekly - 2 sets - 10 reps - Sit to Stand  - 1 x daily - 7 x weekly - 3 x 5  GOALS: Goals reviewed with patient? Yes  SHORT TERM GOALS: Target date: 12/11/2023  Pt will be independent with HEP in order to demonstrate increased ability to perform tasks related to occupation/hobbies. Baseline:  Pt to be given HEP at the next visit. Goal status: INITIAL   LONG TERM GOALS: Target date: 02/05/2024  1.  Patient (> 59 years old) will complete five times sit to stand test in < 15 seconds indicating an increased LE strength and improved balance. Baseline: 24.89 sec Goal status: INITIAL  2.  Patient will improve the LEFS by 11% in order to demonstrate functional independence without pain. Baseline: 20/80; 25%  Goal status: INITIAL   3.  Patient will increase Berg Balance score by > 6 points to demonstrate decreased fall risk during functional activities. Baseline: 42 Goal status: INITIAL   3.  Patient will reduce timed up and go to <11 seconds to reduce fall risk and demonstrate improved transfer/gait ability. Baseline: 26.98 sec with no AD Goal status: INITIAL  4.  Patient will increase 10 meter walk test to >1.51m/s as to improve gait speed for  better community ambulation and to reduce fall risk. Baseline: 29.08 with no AD; 20.65 with cane Goal status: INITIAL  5.  Patient will increase six minute walk test distance to >1000 for progression to community ambulator and improve gait ability Baseline: 465 ft with SPC  Goal status: INITIAL   ASSESSMENT:  CLINICAL IMPRESSION:  Patient arrived with good motivation for completion of pt activities. Pt treatment focussed on functional movement training including practice with transitions and with practice with various planes of movement. Pt did not complain of any pain this session in knee or back musculature. Instructed to continue with HEP when on vacation in order to maintain and continue progress. Pt will continue to benefit from skilled physical therapy intervention to address impairments, improve QOL, and attain therapy goals.    OBJECTIVE IMPAIRMENTS: Abnormal gait, decreased activity tolerance, decreased balance, decreased endurance, decreased mobility, difficulty walking, decreased strength, and pain.  ACTIVITY LIMITATIONS: carrying, lifting, bending, sitting, standing, squatting, stairs, transfers, and locomotion level  PARTICIPATION LIMITATIONS: meal prep, cleaning, laundry, driving, shopping, community activity, and yard work  PERSONAL FACTORS: Age, Fitness, Past/current experiences, Time since onset of injury/illness/exacerbation, and 3+ comorbidities: HTN, CVA, Afib, DJD, R knee pain are also affecting patient's functional outcome.   REHAB POTENTIAL: Good  CLINICAL DECISION MAKING: Evolving/moderate complexity  EVALUATION COMPLEXITY: Moderate  PLAN:  PT FREQUENCY: 2x/week  PT DURATION: 12 weeks  PLANNED INTERVENTIONS: 97750- Physical Performance Testing, 97110-Therapeutic exercises, 97530- Therapeutic activity, 97112- Neuromuscular re-education, 97535- Self Care, [id number]- Manual therapy, [phone]- Gait training, Balance training, Stair training, Dry Needling, Joint  mobilization, Joint manipulation, Spinal manipulation, Spinal mobilization, Vestibular training, Cryotherapy, and Moist heat  PLAN FOR NEXT SESSION:   Note: Portions of this document were prepared using Dragon voice recognition software and although reviewed may contain unintentional dictation errors in syntax, grammar, or spelling.  Edwina Gram PT ,DPT Physical Therapist- [facility]   12/10/23, 4:27 PM

## 2023-12-13 ENCOUNTER — Ambulatory Visit: Admitting: Physical Therapy

## 2023-12-13 DIAGNOSIS — R2689 Other abnormalities of gait and mobility: Secondary | ICD-10-CM

## 2023-12-13 DIAGNOSIS — R41841 Cognitive communication deficit: Secondary | ICD-10-CM | POA: Diagnosis not present

## 2023-12-13 DIAGNOSIS — M6281 Muscle weakness (generalized): Secondary | ICD-10-CM

## 2023-12-13 DIAGNOSIS — G8929 Other chronic pain: Secondary | ICD-10-CM

## 2023-12-13 DIAGNOSIS — R262 Difficulty in walking, not elsewhere classified: Secondary | ICD-10-CM

## 2023-12-13 NOTE — Therapy (Deleted)
 OUTPATIENT PHYSICAL THERAPY NEURO TREATMENT   Patient Name: Pamela Garner MRN: [id number] DOB:10-Jun-1941, 83 y.o., female Today's Date: 12/13/2023   PCP: Melchor Spoon, MD  REFERRING PROVIDER: Melchor Spoon, MD   END OF SESSION:       Past Medical History:  Diagnosis Date   Degenerative joint disease    Dermatophytosis, nail    feet   Hyperlipidemia    Hypertension    Lichen sclerosus    Thyroid disease    Trigeminal neuralgia of left side of face    Past Surgical History:  Procedure Laterality Date   ATRIAL FIBRILLATION ABLATION N/A 09/20/2023   Procedure: ATRIAL FIBRILLATION ABLATION;  Surgeon: Ardeen Kohler, MD;  Location: [facility] INVASIVE CV LAB;  Service: Cardiovascular;  Laterality: N/A;   COLONOSCOPY  2008   DILATION AND CURETTAGE OF UTERUS  1985   endometrial polyps   Patient Active Problem List   Diagnosis Date Noted   New onset atrial flutter (HCC) 06/13/2023   Diplopia 06/12/2023   CVA (cerebral vascular accident) (HCC) 06/12/2023   Atrial fibrillation (HCC) 06/12/2023   GERD (gastroesophageal reflux disease) 10/14/2019   RBBB (right bundle branch block) 10/14/2019   Lichen sclerosus et atrophicus of the vulva 06/13/2017   Thyroid disease    Hypertension    Hyperlipidemia    Degenerative joint disease     ONSET DATE: 06/10/23  REFERRING DIAG: I63.9 (ICD-10-CM) - CVA (cerebral vascular accident) (HCC)  THERAPY DIAG:  No diagnosis found.  Rationale for Evaluation and Treatment: Rehabilitation  SUBJECTIVE:                                                                                                                                                                                             SUBJECTIVE STATEMENT:  Pt reports no pain increase after last session and feels okay to start the session today. Pt got approved for knee injection for gel which she is happy about. Pt was at the beach last week.   Pt accompanied by:  self  PERTINENT HISTORY: medical history significant of obesity, hypertension, hyperlipidemia, thyroid disease presenting with CVA and new onset atrial fibrillation.   PAIN:  Are you having pain? Yes: NPRS scale: 5/10 Pain location: R Knee Pain description: achey Aggravating factors: Standing for prolonged periods Relieving factors: Arthritis cream and gels  PRECAUTIONS: Fall  RED FLAGS: None   WEIGHT BEARING RESTRICTIONS: No  FALLS: Has patient fallen in last 6 months? Yes. Number of falls 3  LIVING ENVIRONMENT: Lives with: lives with their spouse Lives in: House/apartment Stairs: Yes: External: 3 steps; on right going up Has  following equipment at home: Single point cane and Walker - 2 wheeled  PLOF: Independent  PATIENT GOALS: Pt is most concerned with the R knee and working it too much.   OBJECTIVE: Note: Objective measures were completed at Evaluation unless otherwise noted.  DIAGNOSTIC FINDINGS:   EXAM: MRI HEAD WITHOUT CONTRAST  IMPRESSION: Moderate size acute infarct in the right occipital parietal cortex.   COGNITION: Overall cognitive status: Within functional limits for tasks assessed   SENSATION: WFL  COORDINATION: WFL    LOWER EXTREMITY ROM:     Active  Right Eval Left Eval  Hip flexion    Hip extension    Hip abduction    Hip adduction    Hip internal rotation    Hip external rotation    Knee flexion    Knee extension    Ankle dorsiflexion    Ankle plantarflexion    Ankle inversion    Ankle eversion     (Blank rows = not tested)  LOWER EXTREMITY MMT:    MMT Right Eval Left Eval  Hip flexion    Hip extension    Hip abduction    Hip adduction    Hip internal rotation    Hip external rotation    Knee flexion    Knee extension    Ankle dorsiflexion    Ankle plantarflexion    Ankle inversion    Ankle eversion    (Blank rows = not tested)  BED MOBILITY:  Not tested  TRANSFERS: Sit to stand: Modified independence   Assistive device utilized: None     Stand to sit: Modified independence  Assistive device utilized: None     Chair to chair: Modified independence  Assistive device utilized: None       STAIRS: Findings: Level of Assistance: Modified independence, Psychologist, counselling Technique: Step to [employer] Forwards With use of AD: cane but not utilized at all with Single Rail on Right, Number of Stairs: 4, Height of Stairs: 6   , and Comments: Pt descends stairs backwards due to reduced pain when performing this way.  FUNCTIONAL TESTS:  5 times sit to stand: 24.89 sec with UE push up from arm rests Timed up and go (TUG): 26.98 with no AD 6 minute walk test: TBD at next visit 10 meter walk test: 29.08 with no AD, 20.65 with cane Berg Balance Scale: TBD at next visit  PATIENT SURVEYS:  ABC scale 36.25% LEFS 25/80                                                                                                                              TREATMENT DATE: 12/13/23  TE- To improve strength, endurance, mobility, and function of specific targeted muscle groups or improve joint range of motion or improve muscle flexibility  Nustep level 1 x 6 min B UE and LE reciprocal movement training x 6 min, c/o knee and back pain generally but unable to explain reasons  for any exacerbation   TA- To improve functional movements patterns for everyday tasks   STS- 2 x 8 reps with 3KG ball, cues to use trunk flexion for ant weight shift   -Banded sidestepping with RTB around pt knees 2*8 laps ea direction  Standing hip march with 3# AW 2 x 10 reps with UE support     Gait using transport chair as  shopping cart x 750 ft to upstairs waiting area from PT, pt gait looks much more stable and speed much improved with this method compared to with her SPC or without an AD  Unless otherwise stated, CGA was provided and gait belt donned in order to ensure pt safety   PATIENT EDUCATION: Education details: Pt  educated on role of PT and services provided during current POC, along with prognosis and information about the clinic. Person educated: Patient Education method: Explanation Education comprehension: verbalized understanding  HOME EXERCISE PROGRAM:  Access Code: [email or web address] URL: [email or web address] Date: 11/23/2023 Prepared by: Marlynn Singer  Exercises - Seated Long Arc Quad  - 1 x daily - 7 x weekly - 2 sets - 10 reps - 1 sec hold - Seated March  - 1 x daily - 7 x weekly - 2 sets - 10 reps - Sit to Stand  - 1 x daily - 7 x weekly - 3 x 5  GOALS: Goals reviewed with patient? Yes  SHORT TERM GOALS: Target date: 12/11/2023  Pt will be independent with HEP in order to demonstrate increased ability to perform tasks related to occupation/hobbies. Baseline:  Pt to be given HEP at the next visit. Goal status: INITIAL   LONG TERM GOALS: Target date: 02/05/2024  1.  Patient (> 39 years old) will complete five times sit to stand test in < 15 seconds indicating an increased LE strength and improved balance. Baseline: 24.89 sec Goal status: INITIAL  2.  Patient will improve the LEFS by 11% in order to demonstrate functional independence without pain. Baseline: 20/80; 25%  Goal status: INITIAL   3.  Patient will increase Berg Balance score by > 6 points to demonstrate decreased fall risk during functional activities. Baseline: 42 Goal status: INITIAL   3.  Patient will reduce timed up and go to <11 seconds to reduce fall risk and demonstrate improved transfer/gait ability. Baseline: 26.98 sec with no AD Goal status: INITIAL  4.  Patient will increase 10 meter walk test to >1.50m/s as to improve gait speed for better community ambulation and to reduce fall risk. Baseline: 29.08 with no AD; 20.65 with cane Goal status: INITIAL  5.  Patient will increase six minute walk test distance to >1000 for progression to community ambulator and improve gait ability Baseline: 465  ft with SPC  Goal status: INITIAL   ASSESSMENT:  CLINICAL IMPRESSION:  Patient arrived with good motivation for completion of pt activities. Pt treatment focussed on functional movement training including practice with transitions and with practice with various planes of movement. Pt did not complain of any pain this session in knee or back musculature. Pt gait still looking to be improved when walking with B UE support compared to [facility]. Pt will continue to benefit from skilled physical therapy intervention to address impairments, improve QOL, and attain therapy goals.    OBJECTIVE IMPAIRMENTS: Abnormal gait, decreased activity tolerance, decreased balance, decreased endurance, decreased mobility, difficulty walking, decreased strength, and pain.   ACTIVITY LIMITATIONS: carrying, lifting, bending, sitting, standing, squatting, stairs, transfers, and locomotion level  PARTICIPATION  LIMITATIONS: meal prep, cleaning, laundry, driving, shopping, community activity, and yard work  PERSONAL FACTORS: Age, Fitness, Past/current experiences, Time since onset of injury/illness/exacerbation, and 3+ comorbidities: HTN, CVA, Afib, DJD, R knee pain are also affecting patient's functional outcome.   REHAB POTENTIAL: Good  CLINICAL DECISION MAKING: Evolving/moderate complexity  EVALUATION COMPLEXITY: Moderate  PLAN:  PT FREQUENCY: 2x/week  PT DURATION: 12 weeks  PLANNED INTERVENTIONS: 97750- Physical Performance Testing, 97110-Therapeutic exercises, 97530- Therapeutic activity, 97112- Neuromuscular re-education, 97535- Self Care, 62952- Manual therapy, [phone]- Gait training, Balance training, Stair training, Dry Needling, Joint mobilization, Joint manipulation, Spinal manipulation, Spinal mobilization, Vestibular training, Cryotherapy, and Moist heat  PLAN FOR NEXT SESSION:   Note: Portions of this document were prepared using Dragon voice recognition software and although reviewed may contain  unintentional dictation errors in syntax, grammar, or spelling.  Edwina Gram PT ,DPT Physical Therapist- [facility]   12/13/23, 4:16 PM

## 2023-12-13 NOTE — Therapy (Signed)
 OUTPATIENT PHYSICAL THERAPY NEURO TREATMENT   Patient Name: Pamela Garner MRN: [id number] DOB:[birth year], 83 y.o., female Today's Date: 12/13/2023   PCP: Melchor Spoon, MD  REFERRING PROVIDER: Melchor Spoon, MD   END OF SESSION:  PT End of Session - 12/13/23 1621     Visit Number 6    Number of Visits 25    Date for PT Re-Evaluation 02/05/24    Progress Note Due on Visit 10    PT Start Time 1617    PT Stop Time 1657    PT Time Calculation (min) 40 min    Equipment Utilized During Treatment Gait belt    Activity Tolerance Patient tolerated treatment well    Behavior During Therapy WFL for tasks assessed/performed             Past Medical History:  Diagnosis Date   Degenerative joint disease    Dermatophytosis, nail    feet   Hyperlipidemia    Hypertension    Lichen sclerosus    Thyroid disease    Trigeminal neuralgia of left side of face    Past Surgical History:  Procedure Laterality Date   ATRIAL FIBRILLATION ABLATION N/A 09/20/2023   Procedure: ATRIAL FIBRILLATION ABLATION;  Surgeon: Ardeen Kohler, MD;  Location: [facility];  Service: Cardiovascular;  Laterality: N/A;   COLONOSCOPY  2008   DILATION AND CURETTAGE OF UTERUS  1985   endometrial polyps   Patient Active Problem List   Diagnosis Date Noted   New onset atrial flutter (HCC) 06/13/2023   Diplopia 06/12/2023   CVA (cerebral vascular accident) (HCC) 06/12/2023   Atrial fibrillation (HCC) 06/12/2023   GERD (gastroesophageal reflux disease) 10/14/2019   RBBB (right bundle branch block) 10/14/2019   Lichen sclerosus et atrophicus of the vulva 06/13/2017   Thyroid disease    Hypertension    Hyperlipidemia    Degenerative joint disease     ONSET DATE: 06/10/23  REFERRING DIAG: I63.9 (ICD-10-CM) - CVA (cerebral vascular accident) (HCC)  THERAPY DIAG:  Difficulty in walking, not elsewhere classified  Muscle weakness (generalized)  Imbalance  Chronic pain of right  knee  Rationale for Evaluation and Treatment: Rehabilitation  SUBJECTIVE:                                                                                                                                                                                             SUBJECTIVE STATEMENT:  Pt reports no pain increase after last session and feels okay to start the session today. Pt got approved for knee injection for gel which she is happy about. Pt was  at the beach last week.   Pt accompanied by: self  PERTINENT HISTORY: medical history significant of obesity, hypertension, hyperlipidemia, thyroid disease presenting with CVA and new onset atrial fibrillation.   PAIN:  Are you having pain? Yes: NPRS scale: 5/10 Pain location: R Knee Pain description: achey Aggravating factors: Standing for prolonged periods Relieving factors: Arthritis cream and gels  PRECAUTIONS: Fall  RED FLAGS: None   WEIGHT BEARING RESTRICTIONS: No  FALLS: Has patient fallen in last 6 months? Yes. Number of falls 3  LIVING ENVIRONMENT: Lives with: lives with their spouse Lives in: House/apartment Stairs: Yes: External: 3 steps; on right going up Has following equipment at home: Single point cane and Walker - 2 wheeled  PLOF: Independent  PATIENT GOALS: Pt is most concerned with the R knee and working it too much.   OBJECTIVE: Note: Objective measures were completed at Evaluation unless otherwise noted.  DIAGNOSTIC FINDINGS:   EXAM: MRI HEAD WITHOUT CONTRAST  IMPRESSION: Moderate size acute infarct in the right occipital parietal cortex.   COGNITION: Overall cognitive status: Within functional limits for tasks assessed   SENSATION: WFL  COORDINATION: WFL    LOWER EXTREMITY ROM:     Active  Right Eval Left Eval  Hip flexion    Hip extension    Hip abduction    Hip adduction    Hip internal rotation    Hip external rotation    Knee flexion    Knee extension    Ankle dorsiflexion     Ankle plantarflexion    Ankle inversion    Ankle eversion     (Blank rows = not tested)  LOWER EXTREMITY MMT:    MMT Right Eval Left Eval  Hip flexion    Hip extension    Hip abduction    Hip adduction    Hip internal rotation    Hip external rotation    Knee flexion    Knee extension    Ankle dorsiflexion    Ankle plantarflexion    Ankle inversion    Ankle eversion    (Blank rows = not tested)  BED MOBILITY:  Not tested  TRANSFERS: Sit to stand: Modified independence  Assistive device utilized: None     Stand to sit: Modified independence  Assistive device utilized: None     Chair to chair: Modified independence  Assistive device utilized: None       STAIRS: Findings: Level of Assistance: Modified independence, Psychologist, counselling Technique: Step to [employer] Forwards With use of AD: cane but not utilized at all with Single Rail on Right, Number of Stairs: 4, Height of Stairs: 6   , and Comments: Pt descends stairs backwards due to reduced pain when performing this way.  FUNCTIONAL TESTS:  5 times sit to stand: 24.89 sec with UE push up from arm rests Timed up and go (TUG): 26.98 with no AD 6 minute walk test: TBD at next visit 10 meter walk test: 29.08 with no AD, 20.65 with cane Berg Balance Scale: TBD at next visit  PATIENT SURVEYS:  ABC scale 36.25% LEFS 25/80  TREATMENT DATE: 12/13/23  TE- To improve strength, endurance, mobility, and function of specific targeted muscle groups or improve joint range of motion or improve muscle flexibility  Nustep level 1 x 6 min B UE and LE reciprocal movement training x 6 min, c/o knee and back pain generally but unable to explain reasons for any exacerbation   TA- To improve functional movements patterns for everyday tasks   STS- 2 x 8 reps with 3KG ball, cues to use trunk flexion for  ant weight shift   -Banded sidestepping with RTB around pt knees x 10 laps ea direction  -tried squat with sidestep but this caused right knee pain  -lowered band to around knees to increase gluteal activation, increased difficulty but no knee pain noted.  3 x 1 min   Gait with transport chair as  Shopping cart from clinic to elevators, no LOB, good foot clearance   Gait training  Gait using transport chair as  shopping cart x around cones working on obstacle navigation in grocery store, x 3 laps, rest then x 2 laps with 2.5# AW donned. Cues for improved foot clearance    Unless otherwise stated, CGA was provided and gait belt donned in order to ensure pt safety   PATIENT EDUCATION: Education details: Pt educated on role of PT and services provided during current POC, along with prognosis and information about the clinic. Person educated: Patient Education method: Explanation Education comprehension: verbalized understanding  HOME EXERCISE PROGRAM:  Access Code: [email or web address] URL: [email or web address] Date: 11/23/2023 Prepared by: Marlynn Singer  Exercises - Seated Long Arc Quad  - 1 x daily - 7 x weekly - 2 sets - 10 reps - 1 sec hold - Seated March  - 1 x daily - 7 x weekly - 2 sets - 10 reps - Sit to Stand  - 1 x daily - 7 x weekly - 3 x 5  GOALS: Goals reviewed with patient? Yes  SHORT TERM GOALS: Target date: 12/11/2023  Pt will be independent with HEP in order to demonstrate increased ability to perform tasks related to occupation/hobbies. Baseline:  Pt to be given HEP at the next visit. Goal status: INITIAL   LONG TERM GOALS: Target date: 02/05/2024  1.  Patient (> 44 years old) will complete five times sit to stand test in < 15 seconds indicating an increased LE strength and improved balance. Baseline: 24.89 sec Goal status: INITIAL  2.  Patient will improve the LEFS by 11% in order to demonstrate functional independence without pain. Baseline:  20/80; 25%  Goal status: INITIAL   3.  Patient will increase Berg Balance score by > 6 points to demonstrate decreased fall risk during functional activities. Baseline: 42 Goal status: INITIAL   3.  Patient will reduce timed up and go to <11 seconds to reduce fall risk and demonstrate improved transfer/gait ability. Baseline: 26.98 sec with no AD Goal status: INITIAL  4.  Patient will increase 10 meter walk test to >1.34m/s as to improve gait speed for better community ambulation and to reduce fall risk. Baseline: 29.08 with no AD; 20.65 with cane Goal status: INITIAL  5.  Patient will increase six minute walk test distance to >1000 for progression to community ambulator and improve gait ability Baseline: 465 ft with SPC  Goal status: INITIAL   ASSESSMENT:  CLINICAL IMPRESSION:  Patient arrived with good motivation for completion of pt activities. Pt treatment focussed on functional movement training including practice with transitions and with practice  with various planes of movement.Pt had some knee pain with mini squat but it did not linger after the activity was finished.  Pt gait still looking to be improved when walking with B UE support compared to [facility], will discuss rollator use next date and plan/ expectations for AD utilization as she progresses. Pt will continue to benefit from skilled physical therapy intervention to address impairments, improve QOL, and attain therapy goals.    OBJECTIVE IMPAIRMENTS: Abnormal gait, decreased activity tolerance, decreased balance, decreased endurance, decreased mobility, difficulty walking, decreased strength, and pain.   ACTIVITY LIMITATIONS: carrying, lifting, bending, sitting, standing, squatting, stairs, transfers, and locomotion level  PARTICIPATION LIMITATIONS: meal prep, cleaning, laundry, driving, shopping, community activity, and yard work  PERSONAL FACTORS: Age, Fitness, Past/current experiences, Time since onset of  injury/illness/exacerbation, and 3+ comorbidities: HTN, CVA, Afib, DJD, R knee pain are also affecting patient's functional outcome.   REHAB POTENTIAL: Good  CLINICAL DECISION MAKING: Evolving/moderate complexity  EVALUATION COMPLEXITY: Moderate  PLAN:  PT FREQUENCY: 2x/week  PT DURATION: 12 weeks  PLANNED INTERVENTIONS: 97750- Physical Performance Testing, 97110-Therapeutic exercises, 97530- Therapeutic activity, 97112- Neuromuscular re-education, 97535- Self Care, 16109- Manual therapy, [phone]- Gait training, Balance training, Stair training, Dry Needling, Joint mobilization, Joint manipulation, Spinal manipulation, Spinal mobilization, Vestibular training, Cryotherapy, and Moist heat  PLAN FOR NEXT SESSION:  Discuss AD ( rollator ) and discuss benefits of this   Note: Portions of this document were prepared using Dragon voice recognition software and although reviewed may contain unintentional dictation errors in syntax, grammar, or spelling.  Edwina Gram PT ,DPT Physical Therapist- [facility]   12/13/23, 4:22 PM

## 2023-12-16 NOTE — Progress Notes (Unsigned)
 Electrophysiology Clinic Note    Date:  12/17/2023  Patient ID:  Pamela, Garner 11-26-1940, MRN [id number] PCP:  Fernande Ophelia JINNY DOUGLAS, MD  Cardiologist:  Lonni Hanson, MD Electrophysiologist: Fonda Kitty, MD   Discussed the use of AI scribe software for clinical note transcription with the patient, who gave verbal consent to proceed.   Patient Profile    Chief Complaint: AF ablation follow-up  History of Present Illness: Pamela Garner is a 83 y.o. female with PMH notable for parox afib, typical aflutter, CVA, HTN; seen today for Fonda Kitty, MD for routine electrophysiology follow-up s/p Ablation.  She was diagnosed with AFib after presenting to hospital with CVA in 04/2023. She was asymptomatic of afib.  She is s/p AF ablation with isolation of pulm veins, posterior wall, and CTI for aflutter on 09/20/2023 by Dr. Kitty.  She no showed her 1 month post-ablation appointment.   On follow-up today, she is not aware of any A-fib episodes since her ablation procedure.  Historically she has been completely asymptomatic of her A-fib, and that it was often happening at night. She continues to take Eliquis  twice daily with no bleeding concerns.  She states her bilateral groin sites are completely healed. She is having increased lower extremity edema, PCP has increased her Lasix to 20 mg daily and has follow-up with her later this week to monitor.  She states that her edema has significantly improved since the increased Lasix dose. She denies chest pain, palpitations, chest pressure, SOB, dizziness or presyncope.  Her husband joins for appointment.    Arrhythmia/Device History No specialty comments available.     ROS:  Please see the history of present illness. All other systems are reviewed and otherwise negative.    Physical Exam    VS:  BP 122/70 (BP Location: Left Arm, Patient Position: Sitting, Cuff Size: Normal)   Pulse 68   Ht 5' 2 (1.575 m)   Wt 185 lb 9.6  oz (84.2 kg)   SpO2 97%   BMI 33.95 kg/m  BMI: Body mass index is 33.95 kg/m.      Wt Readings from Last 3 Encounters:  12/17/23 185 lb 9.6 oz (84.2 kg)  09/20/23 183 lb (83 kg)  07/17/23 187 lb 9.6 oz (85.1 kg)     GEN- The patient is well appearing, alert and oriented x 3 today.   Lungs- Clear to ausculation bilaterally, normal work of breathing.  Heart- Regular rate and rhythm, no murmurs, rubs or gallops Extremities- 1-2+ peripheral edema, warm, dry    Studies Reviewed   Previous EP, cardiology notes.    EKG is ordered. Personal review of EKG from today shows:     EKG Interpretation Date/Time:  Monday December 17 2023 14:20:42 EDT Ventricular Rate:  68 PR Interval:  306 QRS Duration:  130 QT Interval:  408 QTC Calculation: 433 R Axis:   116  Text Interpretation: Sinus rhythm with 1st degree A-V block Right bundle branch block Confirmed by Reymond Maynez [phone]) on 12/17/2023 2:22:01 PM    CT chest over-read 08/27/2023 Chronic interstitial lung disease.   Cardiac CT, 08/27/2023 1. There is normal pulmonary vein drainage into the left atrium. (2 on the right and 2 on the left) with ostial measurements as above.  2. The left atrial appendage is a Windsock-cactus type with ostial size 31 x 26 mm and length 54 mm, Area 60 mm2. There is no thrombus in the left atrial appendage.  3. The esophagus  runs in the left atrial midline and is not in the proximity to any of the pulmonary veins.  4. Coronary calcium  score of 0.  Long term monitor, 07/12/2023   The patient was monitored for 13 days, 23 hours.  10 days, 2 hours were suitable for analysis after removal of artifact.   The predominant rhythm was sinus with an average rate of 64 bpm (range 45-99 bpm in sinus).   There were rare PACs and PVCs.   Paroxysmal atrial fibrillation was observed, with an average ventricular rate of 67 bpm (range 42-101 bpm in atrial fibrillation).  Longest episode lasted 4 hours, 25 minutes.   Atrial fibrillation burden was 5%.   No prolonged pause was seen.   Single patient triggered event corresponded to normal sinus rhythm.  TTE, 06/13/2023  1. Left ventricular ejection fraction, by estimation, is 60 to 65%. The left ventricle has normal function. The left ventricle has no regional wall motion abnormalities. There is moderate left ventricular hypertrophy. Left ventricular diastolic parameters are indeterminate.   2. Right ventricular systolic function is normal. The right ventricular size is normal.   3. The mitral valve is normal in structure. Mild mitral valve regurgitation. No evidence of mitral stenosis.   4. The aortic valve is normal in structure. Aortic valve regurgitation is not visualized. No aortic stenosis is present.   5. The inferior vena cava is normal in size with greater than 50% respiratory variability, suggesting right atrial pressure of 3 mmHg.   6. Rhythm is atrial flutter    Assessment and Plan     #) Parox Afib #) typical aflutter S/p AFib, aflutter ablation 08/2023 by Dr. Kennyth She is unaware of any A-fib episodes since her ablation, follows been asymptomatic arrhythmia Update 2-week monitor to confirm no A-fib   #) Hypercoag d/t parox afib CHA2DS2-VASc Score = at least 6 [CHF History: 0, HTN History: 1, Diabetes History: 0, Stroke History: 2, Vascular Disease History: 0, Age Score: 2, Gender Score: 1].  Therefore, the patient's annual risk of stroke is 9.7 %.    Stroke ppx - 5mg  eliquis  BID, appropriately dosed No bleeding concerns  #) lower extremity edema PCP recently increased lasix from 20mg  PRN to 20mg  daily She has had improvement in her edema with this regument She has close follow-up with PCP later this week      Current medicines are reviewed at length with the patient today.   The patient does not have concerns regarding her medicines.  The following changes were made today:  none  Labs/ tests ordered today include:  Orders  Placed This Encounter  Procedures   LONG TERM MONITOR (3-14 DAYS)   EKG 12-Lead     Disposition: Follow up with Dr. Kennyth or EP APP in 6 months   Signed, Chantal Needle, NP  12/17/23  2:39 PM  Electrophysiology [facility]

## 2023-12-17 ENCOUNTER — Ambulatory Visit

## 2023-12-17 ENCOUNTER — Ambulatory Visit: Attending: Cardiology | Admitting: Cardiology

## 2023-12-17 VITALS — BP 122/70 | HR 68 | Ht 62.0 in | Wt 185.6 lb

## 2023-12-17 DIAGNOSIS — I480 Paroxysmal atrial fibrillation: Secondary | ICD-10-CM

## 2023-12-17 DIAGNOSIS — I483 Typical atrial flutter: Secondary | ICD-10-CM | POA: Diagnosis not present

## 2023-12-17 DIAGNOSIS — D6869 Other thrombophilia: Secondary | ICD-10-CM | POA: Diagnosis not present

## 2023-12-17 NOTE — Patient Instructions (Signed)
 Medication Instructions:  The current medical regimen is effective;  continue present plan and medications as directed. Please refer to the Current Medication list given to you today.   *If you need a refill on your cardiac medications before your next appointment, please call your pharmacy*  Testing/Procedures:  ZIO XT- Long Term Monitor Instructions  Your physician has requested you wear a ZIO patch monitor for 14 days.  This is a single patch monitor. [employer] supplies one patch monitor per enrollment. Additional stickers are not available. Please do not apply patch if you will be having a Nuclear Stress Test,  Echocardiogram, Cardiac CT, MRI, or Chest Xray during the period you would be wearing the  monitor. The patch cannot be worn during these tests. You cannot remove and re-apply the  ZIO XT patch monitor.  Your ZIO patch monitor will be mailed 3 day USPS to your address on file. It may take 3-5 days  to receive your monitor after you have been enrolled.  Once you have received your monitor, please review the enclosed instructions. Your monitor  has already been registered assigning a specific monitor serial # to you.  Billing and Patient Assistance Program Information  We have supplied [employer] with any of your insurance information on file for billing purposes. [employer] offers a sliding scale Patient Assistance Program for patients that do not have  insurance, or whose insurance does not completely cover the cost of the ZIO monitor.  You must apply for the Patient Assistance Program to qualify for this discounted rate.  To apply, please call [employer] at [phone], select option 4, select option 2, ask to apply for  Patient Assistance Program. Meredeth will ask your household income, and how many people  are in your household. They will quote your out-of-pocket cost based on that information.  [employer] will also be able to set up a 89-month, interest-free payment plan if  needed.  Applying the monitor   Shave hair from upper left chest.  Hold abrader disc by orange tab. Rub abrader in 40 strokes over the upper left chest as  indicated in your monitor instructions.  Clean area with 4 enclosed alcohol pads. Let dry.  Apply patch as indicated in monitor instructions. Patch will be placed under collarbone on left  side of chest with arrow pointing upward.  Rub patch adhesive wings for 2 minutes. Remove white label marked 1. Remove the white  label marked 2. Rub patch adhesive wings for 2 additional minutes.  While looking in a mirror, press and release button in center of patch. A small green light will  flash 3-4 times. This will be your only indicator that the monitor has been turned on.  Do not shower for the first 24 hours. You may shower after the first 24 hours.  Press the button if you feel a symptom. You will hear a small click. Record Date, Time and  Symptom in the Patient Logbook.  When you are ready to remove the patch, follow instructions on the last 2 pages of Patient  Logbook. Stick patch monitor onto the last page of Patient Logbook.  Place Patient Logbook in the blue and white box. Use locking tab on box and tape box closed  securely. The blue and white box has prepaid postage on it. Please place it in the mailbox as  soon as possible. Your physician should have your test results approximately 7 days after the  monitor has been mailed back to [facility].  Call [employer] Customer Care at [phone] if you have questions regarding  your ZIO XT patch monitor. Call them immediately if you see an orange light blinking on your  monitor.  If your monitor falls off in less than 4 days, contact our Monitor department at [phone].  If your monitor becomes loose or falls off after 4 days call [employer] at [phone] for  suggestions on securing your monitor   Follow-Up: At [facility], you and your health needs are our  priority.  As part of our continuing mission to provide you with exceptional heart care, our providers are all part of one team.  This team includes your primary Cardiologist (physician) and Advanced Practice Providers or APPs (Physician Assistants and Nurse Practitioners) who all work together to provide you with the care you need, when you need it.  Your next appointment:   6 month(s)  Provider:   Fonda Kitty, MD or Suzann Riddle, NP    We recommend signing up for the patient portal called MyChart.  Sign up information is provided on this After Visit Summary.  MyChart is used to connect with patients for Virtual Visits (Telemedicine).  Patients are able to view lab/test results, encounter notes, upcoming appointments, etc.  Non-urgent messages can be sent to your provider as well.   To learn more about what you can do with MyChart, go to [email or web address].

## 2023-12-18 ENCOUNTER — Encounter: Admitting: Speech Pathology

## 2023-12-18 ENCOUNTER — Ambulatory Visit

## 2023-12-18 ENCOUNTER — Telehealth: Payer: Self-pay

## 2023-12-18 NOTE — Telephone Encounter (Signed)
 Patient Name: Jenasia Dolinar MRN: [id number] DOB:04/26/1941, 83 y.o., female Today's Date: 12/18/2023  Pt contacted via telephone and dino spoke with patient who stated she didn't realize she had a visit today. Informed her of her next visit on Thursday and she stated she would be there.      Reyes LOISE London, PT 12/18/2023, 5:06 PM

## 2023-12-20 ENCOUNTER — Ambulatory Visit

## 2023-12-20 ENCOUNTER — Encounter: Admitting: Speech Pathology

## 2023-12-20 DIAGNOSIS — M6281 Muscle weakness (generalized): Secondary | ICD-10-CM

## 2023-12-20 DIAGNOSIS — R262 Difficulty in walking, not elsewhere classified: Secondary | ICD-10-CM

## 2023-12-20 DIAGNOSIS — R2689 Other abnormalities of gait and mobility: Secondary | ICD-10-CM

## 2023-12-20 DIAGNOSIS — R41841 Cognitive communication deficit: Secondary | ICD-10-CM | POA: Diagnosis not present

## 2023-12-20 DIAGNOSIS — G8929 Other chronic pain: Secondary | ICD-10-CM

## 2023-12-20 NOTE — Therapy (Signed)
 OUTPATIENT PHYSICAL THERAPY NEURO TREATMENT   Patient Name: Pamela Garner MRN: [id number] DOB:25-Feb-1941, 83 y.o., female Today's Date: 12/20/2023   PCP: Fernande Ophelia JINNY DOUGLAS, MD  REFERRING PROVIDER: Fernande Ophelia JINNY DOUGLAS, MD   END OF SESSION:  PT End of Session - 12/20/23 1411     Visit Number 7    Number of Visits 25    Date for PT Re-Evaluation 02/05/24    Progress Note Due on Visit 10    PT Start Time 1403    PT Stop Time 1444    PT Time Calculation (min) 41 min    Equipment Utilized During Treatment Gait belt    Activity Tolerance Patient tolerated treatment well    Behavior During Therapy WFL for tasks assessed/performed              Past Medical History:  Diagnosis Date   Degenerative joint disease    Dermatophytosis, nail    feet   Hyperlipidemia    Hypertension    Lichen sclerosus    Thyroid disease    Trigeminal neuralgia of left side of face    Past Surgical History:  Procedure Laterality Date   ATRIAL FIBRILLATION ABLATION N/A 09/20/2023   Procedure: ATRIAL FIBRILLATION ABLATION;  Surgeon: Kennyth Chew, MD;  Location: [facility];  Service: Cardiovascular;  Laterality: N/A;   COLONOSCOPY  2008   DILATION AND CURETTAGE OF UTERUS  1985   endometrial polyps   Patient Active Problem List   Diagnosis Date Noted   New onset atrial flutter (HCC) 06/13/2023   Diplopia 06/12/2023   CVA (cerebral vascular accident) (HCC) 06/12/2023   Atrial fibrillation (HCC) 06/12/2023   GERD (gastroesophageal reflux disease) 10/14/2019   RBBB (right bundle branch block) 10/14/2019   Lichen sclerosus et atrophicus of the vulva 06/13/2017   Thyroid disease    Hypertension    Hyperlipidemia    Degenerative joint disease     ONSET DATE: 06/10/23  REFERRING DIAG: I63.9 (ICD-10-CM) - CVA (cerebral vascular accident) (HCC)  THERAPY DIAG:  Difficulty in walking, not elsewhere classified  Muscle weakness (generalized)  Imbalance  Chronic pain of right  knee  Rationale for Evaluation and Treatment: Rehabilitation  SUBJECTIVE:                                                                                                                                                                                             SUBJECTIVE STATEMENT:  Pt reports ankles are still swelling  and states had her 1st of 3 injections last week and can't really tell too much difference so far.    Pt  accompanied by: self  PERTINENT HISTORY: medical history significant of obesity, hypertension, hyperlipidemia, thyroid disease presenting with CVA and new onset atrial fibrillation.   PAIN:  Are you having pain? Yes: NPRS scale: 5/10 Pain location: R Knee Pain description: achey Aggravating factors: Standing for prolonged periods Relieving factors: Arthritis cream and gels  PRECAUTIONS: Fall  RED FLAGS: None   WEIGHT BEARING RESTRICTIONS: No  FALLS: Has patient fallen in last 6 months? Yes. Number of falls 3  LIVING ENVIRONMENT: Lives with: lives with their spouse Lives in: House/apartment Stairs: Yes: External: 3 steps; on right going up Has following equipment at home: Single point cane and Walker - 2 wheeled  PLOF: Independent  PATIENT GOALS: Pt is most concerned with the R knee and working it too much.   OBJECTIVE: Note: Objective measures were completed at Evaluation unless otherwise noted.  DIAGNOSTIC FINDINGS:   EXAM: MRI HEAD WITHOUT CONTRAST  IMPRESSION: Moderate size acute infarct in the right occipital parietal cortex.   COGNITION: Overall cognitive status: Within functional limits for tasks assessed   SENSATION: WFL  COORDINATION: WFL    LOWER EXTREMITY ROM:     Active  Right Eval Left Eval  Hip flexion    Hip extension    Hip abduction    Hip adduction    Hip internal rotation    Hip external rotation    Knee flexion    Knee extension    Ankle dorsiflexion    Ankle plantarflexion    Ankle inversion     Ankle eversion     (Blank rows = not tested)  LOWER EXTREMITY MMT:    MMT Right Eval Left Eval  Hip flexion    Hip extension    Hip abduction    Hip adduction    Hip internal rotation    Hip external rotation    Knee flexion    Knee extension    Ankle dorsiflexion    Ankle plantarflexion    Ankle inversion    Ankle eversion    (Blank rows = not tested)  BED MOBILITY:  Not tested  TRANSFERS: Sit to stand: Modified independence  Assistive device utilized: None     Stand to sit: Modified independence  Assistive device utilized: None     Chair to chair: Modified independence  Assistive device utilized: None       STAIRS: Findings: Level of Assistance: Modified independence, Psychologist, counselling Technique: Step to [employer] Forwards With use of AD: cane but not utilized at all with Single Rail on Right, Number of Stairs: 4, Height of Stairs: 6   , and Comments: Pt descends stairs backwards due to reduced pain when performing this way.  FUNCTIONAL TESTS:  5 times sit to stand: 24.89 sec with UE push up from arm rests Timed up and go (TUG): 26.98 with no AD 6 minute walk test: TBD at next visit 10 meter walk test: 29.08 with no AD, 20.65 with cane Berg Balance Scale: TBD at next visit  PATIENT SURVEYS:  ABC scale 36.25% LEFS 25/80  TREATMENT DATE: 12/20/23      TA- To improve functional movements patterns for everyday tasks   STS- x 12  reps VC for  ant weight shift   NMR:  Dynamic hip march- initially with BUE support in // bars - down and back x2 then progressed to 1 UE support x 2 then no UE support.  Dynamic side step - up/over 1/2 foam roll with 1UE - x 15 reps Dynamic for forward step up/over 1/2 foam (1 UE support) x 12 reps each Side.   Gait training Instruction in using [phone] (including how to lock/unlock brakes). VC to  stand erect and stay within walker - Patient ambulated from gym to elevator and onto 1st floor- CGA and good reciprocal steps- No report of pain.   Unless otherwise stated, CGA was provided and gait belt donned in order to ensure pt safety  Self care:  Discussed safety with mobility and discussed option of using 4WW vs. Cane for longer community distances. Discussed performing pain-free LE strengthening exercises  and progressive walking as symptoms allowed- Verbally reviewed HEP and discussed symptoms of A- flutter including fast (racing) HR, Chest pain, Feeling very tired, SOB. Discussed importance of elevating LE to improve overall LE swelling.    PATIENT EDUCATION: Education details: Pt educated on role of PT and services provided during current POC, along with prognosis and information about the clinic. Person educated: Patient Education method: Explanation Education comprehension: verbalized understanding  HOME EXERCISE PROGRAM:  Access Code: [email or web address] URL: [email or web address] Date: 11/23/2023 Prepared by: Lonni Gainer  Exercises - Seated Long Arc Quad  - 1 x daily - 7 x weekly - 2 sets - 10 reps - 1 sec hold - Seated March  - 1 x daily - 7 x weekly - 2 sets - 10 reps - Sit to Stand  - 1 x daily - 7 x weekly - 3 x 5  GOALS: Goals reviewed with patient? Yes  SHORT TERM GOALS: Target date: 12/11/2023  Pt will be independent with HEP in order to demonstrate increased ability to perform tasks related to occupation/hobbies. Baseline:  Pt to be given HEP at the next visit. Goal status: INITIAL   LONG TERM GOALS: Target date: 02/05/2024  1.  Patient (> 42 years old) will complete five times sit to stand test in < 15 seconds indicating an increased LE strength and improved balance. Baseline: 24.89 sec Goal status: INITIAL  2.  Patient will improve the LEFS by 11% in order to demonstrate functional independence without pain. Baseline: 20/80; 25%  Goal status:  INITIAL   3.  Patient will increase Berg Balance score by > 6 points to demonstrate decreased fall risk during functional activities. Baseline: 42 Goal status: INITIAL   3.  Patient will reduce timed up and go to <11 seconds to reduce fall risk and demonstrate improved transfer/gait ability. Baseline: 26.98 sec with no AD Goal status: INITIAL  4.  Patient will increase 10 meter walk test to >1.75m/s as to improve gait speed for better community ambulation and to reduce fall risk. Baseline: 29.08 with no AD; 20.65 with cane Goal status: INITIAL  5.  Patient will increase six minute walk test distance to >1000 for progression to community ambulator and improve gait ability Baseline: 465 ft with SPC  Goal status: INITIAL   ASSESSMENT:  CLINICAL IMPRESSION:  Treatment continued per plan of care but modified as patient received recent injection into Right knee- Very intently monitored for sign of pain or  knee giving way. She responded well to balance/LE strengthening with no obvious knee buckling with RLE and no LOB. She was instructed in use of 4WW for longer community distances and responded well with VC for posture and again no pain or significant difficulty with modified treatment today. She was provided brief rest breaks to recover between all activities today.  Pt will continue to benefit from skilled physical therapy intervention to address impairments, improve QOL, and attain therapy goals.    OBJECTIVE IMPAIRMENTS: Abnormal gait, decreased activity tolerance, decreased balance, decreased endurance, decreased mobility, difficulty walking, decreased strength, and pain.   ACTIVITY LIMITATIONS: carrying, lifting, bending, sitting, standing, squatting, stairs, transfers, and locomotion level  PARTICIPATION LIMITATIONS: meal prep, cleaning, laundry, driving, shopping, community activity, and yard work  PERSONAL FACTORS: Age, Fitness, Past/current experiences, Time since onset of  injury/illness/exacerbation, and 3+ comorbidities: HTN, CVA, Afib, DJD, R knee pain are also affecting patient's functional outcome.   REHAB POTENTIAL: Good  CLINICAL DECISION MAKING: Evolving/moderate complexity  EVALUATION COMPLEXITY: Moderate  PLAN:  PT FREQUENCY: 2x/week  PT DURATION: 12 weeks  PLANNED INTERVENTIONS: 97750- Physical Performance Testing, 97110-Therapeutic exercises, 97530- Therapeutic activity, 97112- Neuromuscular re-education, 97535- Self Care, [id number]- Manual therapy, [phone]- Gait training, Balance training, Stair training, Dry Needling, Joint mobilization, Joint manipulation, Spinal manipulation, Spinal mobilization, Vestibular training, Cryotherapy, and Moist heat  PLAN FOR NEXT SESSION:  Dynamic balance progression  Progression of LE strengthening and endurance based activities     Reyes LOISE London PT Physical Therapist- [facility]  [facility]   12/20/23, 3:34 PM

## 2023-12-24 ENCOUNTER — Encounter: Admitting: Speech Pathology

## 2023-12-24 ENCOUNTER — Ambulatory Visit: Admitting: Cardiology

## 2023-12-25 ENCOUNTER — Ambulatory Visit: Attending: Neurology

## 2023-12-25 DIAGNOSIS — M25561 Pain in right knee: Secondary | ICD-10-CM | POA: Insufficient documentation

## 2023-12-25 DIAGNOSIS — G8929 Other chronic pain: Secondary | ICD-10-CM | POA: Insufficient documentation

## 2023-12-25 DIAGNOSIS — M6281 Muscle weakness (generalized): Secondary | ICD-10-CM | POA: Diagnosis present

## 2023-12-25 DIAGNOSIS — R262 Difficulty in walking, not elsewhere classified: Secondary | ICD-10-CM | POA: Insufficient documentation

## 2023-12-25 DIAGNOSIS — R2689 Other abnormalities of gait and mobility: Secondary | ICD-10-CM | POA: Diagnosis present

## 2023-12-25 NOTE — Therapy (Signed)
 OUTPATIENT PHYSICAL THERAPY TREATMENT  Patient Name: Pamela Garner MRN: [id number] DOB:1941-03-16, 83 y.o., female Today's Date: 12/25/2023  PCP: Fernande Ophelia JINNY DOUGLAS, MD  REFERRING PROVIDER: Fernande Ophelia JINNY DOUGLAS, MD  END OF SESSION:  PT End of Session - 12/25/23 0940     Visit Number 8    Number of Visits 25    Date for PT Re-Evaluation 02/05/24    Authorization Type [employer] Medicare    Progress Note Due on Visit 10    PT Start Time 0935    PT Stop Time 1015    PT Time Calculation (min) 40 min    Equipment Utilized During Treatment Gait belt    Activity Tolerance Patient tolerated treatment well;No increased pain    Behavior During Therapy WFL for tasks assessed/performed          Past Medical History:  Diagnosis Date   Degenerative joint disease    Dermatophytosis, nail    feet   Hyperlipidemia    Hypertension    Lichen sclerosus    Thyroid disease    Trigeminal neuralgia of left side of face    Past Surgical History:  Procedure Laterality Date   ATRIAL FIBRILLATION ABLATION N/A 09/20/2023   Procedure: ATRIAL FIBRILLATION ABLATION;  Surgeon: Kennyth Chew, MD;  Location: [facility] INVASIVE CV LAB;  Service: Cardiovascular;  Laterality: N/A;   COLONOSCOPY  2008   DILATION AND CURETTAGE OF UTERUS  1985   endometrial polyps   Patient Active Problem List   Diagnosis Date Noted   New onset atrial flutter (HCC) 06/13/2023   Diplopia 06/12/2023   CVA (cerebral vascular accident) (HCC) 06/12/2023   Atrial fibrillation (HCC) 06/12/2023   GERD (gastroesophageal reflux disease) 10/14/2019   RBBB (right bundle branch block) 10/14/2019   Lichen sclerosus et atrophicus of the vulva 06/13/2017   Thyroid disease    Hypertension    Hyperlipidemia    Degenerative joint disease    ONSET DATE: 06/10/23  REFERRING DIAG: I63.9 (ICD-10-CM) - CVA (cerebral vascular accident) (HCC)  THERAPY DIAG:  Difficulty in walking, not elsewhere classified  Muscle weakness  (generalized)  Imbalance  Chronic pain of right knee  Rationale for Evaluation and Treatment: Rehabilitation  SUBJECTIVE:                                                                                                                                                                                            SUBJECTIVE STATEMENT: Pt says her back recently started hurting after PT here. Pt was given some back exercises by her orthopedic doctor. Pt says no HEP performance at home, doesn't think she's supposed to  be doing any exercises assignments.   PERTINENT HISTORY: 82yoF referred OPPT s/p CVA and falls. PMH: obesity, hypertension, hyperlipidemia, thyroid disease, CVA and new onset atrial fibrillation.   PAIN:  Are you having pain? Low back pain improving 0/10, Rt knee pain about the same 4/10  PRECAUTIONS: Fall  WEIGHT BEARING RESTRICTIONS: No  FALLS: Has patient fallen in last 6 months? Yes. Number of falls 3  LIVING ENVIRONMENT: Lives with: lives with their spouse Lives in: House/apartment Stairs: Yes: External: 3 steps; on right going up Has following equipment at home: Single point cane and Walker - 2 wheeled  PLOF: Independent  PATIENT GOALS: Pt is most concerned with the Rt knee and working it too much.   OBJECTIVE: Note: Objective measures were completed at Evaluation unless otherwise noted.  DIAGNOSTIC FINDINGS:  EXAM: MRI HEAD WITHOUT CONTRAST  IMPRESSION: Moderate size acute infarct in the right occipital parietal cortex.   STAIRS: Findings: Level of Assistance: Modified independence, Stair Negotiation Technique: Step to Pattern Backwards Forwards With use of AD: cane but not utilized at all with Single Rail on Right, Number of Stairs: 4, Height of Stairs: 6   , and Comments: Pt descends stairs backwards due to reduced pain when performing this way.  FUNCTIONAL TESTS:  5 times sit to stand: 24.89 sec with UE push up from arm rests Timed up and go  (TUG): 26.98 with no AD 6 minute walk test: TBD at next visit 10 meter walk test: 29.08 with no AD, 20.65 with cane Berg Balance Scale: TBD at next visit  PATIENT SURVEYS:  ABC scale 36.25% LEFS 25/80                                                                                                                              TREATMENT DATE: 12/25/23 -AA/ROM Nustep Level 2, 8 minutes, seat 8, arms 8  HEP reprint and review; encouraged to regain compliance on this. Pt  has a difficult time finding set/reps information at top of sheet.   -STS from elevated surface height x10  -Seated LAQ 1x10 bilat  -Seated stabilization marching x20, alternating -standing heel raise x15 with BUE counter support (new addition 12/25/23)  -STS from elevated surface height x10  -Seated LAQ 1x10 bilat  -Seated stabilization marching x20, alternating -standing heel raise x15 with BUE counter support (new addition 12/25/23)  -AMB overground with 4WW to // bars   -lateral side stepping in // bars 4 round trips -alternate forward/backward in // bars 4x     PATIENT EDUCATION: Education details: Importance of HEP compliance in achieving best outcomes from PT.  Person educated: Patient Education method: Explanation Education comprehension: verbalized understanding  HOME EXERCISE PROGRAM: Access Code: [email or web address] URL: [email or web address] Date: 12/25/2023 Prepared by: Peggye Linear  Exercises - Seated Long Arc Quad  - 1 x daily - 7 x weekly - 2 sets - 10 reps - 1 sec hold - Seated March  - 1 x  daily - 7 x weekly - 2 sets - 10 reps - Sit to Stand  - 1 x daily - 7 x weekly - 2 sets - 8 reps - Heel Raises with Counter Support  - 1 x daily - 7 x weekly - 2 sets - 15 reps  GOALS: Goals reviewed with patient? Yes  SHORT TERM GOALS: Target date: 12/11/2023 Pt will be independent with HEP in order to demonstrate increased ability to perform tasks related to occupation/hobbies. Baseline:  Issued  5/30, not performing anymore.  Goal status: NOT MET   LONG TERM GOALS: Target date: 02/05/2024 1.  Patient (> 36 years old) will complete five times sit to stand test in < 15 seconds indicating an increased LE strength and improved balance. Baseline: 24.89 sec Goal status: INITIAL  2.  Patient will improve the LEFS by 11% in order to demonstrate functional independence without pain. Baseline: 20/80; 25%  Goal status: INITIAL   3.  Patient will increase Berg Balance score by > 6 points to demonstrate decreased fall risk during functional activities. Baseline: 42 Goal status: INITIAL   3.  Patient will reduce timed up and go to <11 seconds to reduce fall risk and demonstrate improved transfer/gait ability. Baseline: 26.98 sec with no AD Goal status: INITIAL  4.  Patient will increase 10 meter walk test to >1.18m/s as to improve gait speed for better community ambulation and to reduce fall risk. Baseline: 29.08 with no AD; 20.65 with cane Goal status: INITIAL  5.  Patient will increase six minute walk test distance to >1000 for progression to community ambulator and improve gait ability Baseline: 465 ft with SPC  Goal status: INITIAL   ASSESSMENT:  CLINICAL IMPRESSION: Pt continued to participate in therapy. Pt is challenged by many of these activities, requires encouragement as well as intermittent assistance. Pt encouraged to make time for home exercises in spite of a busy summer schedule, reprinted HEP handout which may have been lost. Pt will continue to benefit from skilled physical therapy intervention to address impairments, improve QOL, and attain therapy goals.    OBJECTIVE IMPAIRMENTS: Abnormal gait, decreased activity tolerance, decreased balance, decreased endurance, decreased mobility, difficulty walking, decreased strength, and pain.  ACTIVITY LIMITATIONS: carrying, lifting, bending, sitting, standing, squatting, stairs, transfers, and locomotion level PARTICIPATION  LIMITATIONS: meal prep, cleaning, laundry, driving, shopping, community activity, and yard work PERSONAL FACTORS: Age, Fitness, Past/current experiences, Time since onset of injury/illness/exacerbation, and 3+ comorbidities: HTN, CVA, Afib, DJD, R knee pain are also affecting patient's functional outcome.  REHAB POTENTIAL: Good CLINICAL DECISION MAKING: Evolving/moderate complexity EVALUATION COMPLEXITY: Moderate  PLAN:  PT FREQUENCY: 2x/week  PT DURATION: 12 weeks  PLANNED INTERVENTIONS: 97750- Physical Performance Testing, 97110-Therapeutic exercises, 97530- Therapeutic activity, 97112- Neuromuscular re-education, 97535- Self Care, [id number]- Manual therapy, [phone]- Gait training, Balance training, Stair training, Dry Needling, Joint mobilization, Joint manipulation, Spinal manipulation, Spinal mobilization, Vestibular training, Cryotherapy, and Moist heat  PLAN FOR NEXT SESSION:  Dynamic balance progression  Progression of LE strengthening and endurance based activities    9:42 AM, 12/25/23 Peggye JAYSON Linear, PT, DPT Physical Therapist - [facility]  Outpatient Physical Therapy- [facility] [phone]

## 2023-12-27 ENCOUNTER — Ambulatory Visit

## 2023-12-27 ENCOUNTER — Encounter: Admitting: Speech Pathology

## 2023-12-31 ENCOUNTER — Ambulatory Visit

## 2023-12-31 ENCOUNTER — Encounter: Admitting: Speech Pathology

## 2023-12-31 DIAGNOSIS — R262 Difficulty in walking, not elsewhere classified: Secondary | ICD-10-CM

## 2023-12-31 DIAGNOSIS — R2689 Other abnormalities of gait and mobility: Secondary | ICD-10-CM

## 2023-12-31 DIAGNOSIS — M6281 Muscle weakness (generalized): Secondary | ICD-10-CM

## 2023-12-31 DIAGNOSIS — G8929 Other chronic pain: Secondary | ICD-10-CM

## 2023-12-31 NOTE — Therapy (Signed)
 OUTPATIENT PHYSICAL THERAPY TREATMENT  Patient Name: Pamela Garner MRN: [id number] DOB:[birth year], 83 y.o., female Today's Date: 12/31/2023  PCP: Fernande Ophelia JINNY DOUGLAS, MD  REFERRING PROVIDER: Fernande Ophelia JINNY DOUGLAS, MD  END OF SESSION:  PT End of Session - 12/31/23 0940     Visit Number 9    Number of Visits 25    Date for PT Re-Evaluation 02/05/24    Authorization Type [employer] Medicare    Progress Note Due on Visit 10    PT Start Time [phone]    PT Stop Time 1003    PT Time Calculation (min) 26 min    Equipment Utilized During Treatment Gait belt    Activity Tolerance Patient tolerated treatment well;No increased pain    Behavior During Therapy WFL for tasks assessed/performed          Past Medical History:  Diagnosis Date   Degenerative joint disease    Dermatophytosis, nail    feet   Hyperlipidemia    Hypertension    Lichen sclerosus    Thyroid disease    Trigeminal neuralgia of left side of face    Past Surgical History:  Procedure Laterality Date   ATRIAL FIBRILLATION ABLATION N/A 09/20/2023   Procedure: ATRIAL FIBRILLATION ABLATION;  Surgeon: Kennyth Chew, MD;  Location: [facility] INVASIVE CV LAB;  Service: Cardiovascular;  Laterality: N/A;   COLONOSCOPY  2008   DILATION AND CURETTAGE OF UTERUS  1985   endometrial polyps   Patient Active Problem List   Diagnosis Date Noted   New onset atrial flutter (HCC) 06/13/2023   Diplopia 06/12/2023   CVA (cerebral vascular accident) (HCC) 06/12/2023   Atrial fibrillation (HCC) 06/12/2023   GERD (gastroesophageal reflux disease) 10/14/2019   RBBB (right bundle branch block) 10/14/2019   Lichen sclerosus et atrophicus of the vulva 06/13/2017   Thyroid disease    Hypertension    Hyperlipidemia    Degenerative joint disease    ONSET DATE: 06/10/23  REFERRING DIAG: I63.9 (ICD-10-CM) - CVA (cerebral vascular accident) (HCC)  THERAPY DIAG:  Difficulty in walking, not elsewhere classified  Imbalance  Muscle weakness  (generalized)  Chronic pain of right knee  Rationale for Evaluation and Treatment: Rehabilitation  SUBJECTIVE:                                                                                                                                                                                            SUBJECTIVE STATEMENT: Pt states going to Ortho after PT to receive her 3rd injection for her Right knee. Requests to end a little early to make in to next appt.   PERTINENT HISTORY:  82yoF referred OPPT s/p CVA and falls. PMH: obesity, hypertension, hyperlipidemia, thyroid disease, CVA and new onset atrial fibrillation.   PAIN:  Are you having pain? Low back pain improving 0/10, Rt knee pain about the same 4/10  PRECAUTIONS: Fall  WEIGHT BEARING RESTRICTIONS: No  FALLS: Has patient fallen in last 6 months? Yes. Number of falls 3  LIVING ENVIRONMENT: Lives with: lives with their spouse Lives in: House/apartment Stairs: Yes: External: 3 steps; on right going up Has following equipment at home: Single point cane and Walker - 2 wheeled  PLOF: Independent  PATIENT GOALS: Pt is most concerned with the Rt knee and working it too much.   OBJECTIVE: Note: Objective measures were completed at Evaluation unless otherwise noted.  DIAGNOSTIC FINDINGS:  EXAM: MRI HEAD WITHOUT CONTRAST  IMPRESSION: Moderate size acute infarct in the right occipital parietal cortex.   STAIRS: Findings: Level of Assistance: Modified independence, Stair Negotiation Technique: Step to Pattern Backwards Forwards With use of AD: cane but not utilized at all with Single Rail on Right, Number of Stairs: 4, Height of Stairs: 6   , and Comments: Pt descends stairs backwards due to reduced pain when performing this way.  FUNCTIONAL TESTS:  5 times sit to stand: 24.89 sec with UE push up from arm rests Timed up and go (TUG): 26.98 with no AD 6 minute walk test: TBD at next visit 10 meter walk test: 29.08 with no  AD, 20.65 with cane Berg Balance Scale: TBD at next visit  PATIENT SURVEYS:  ABC scale 36.25% LEFS 25/80                                                                                                                              TREATMENT DATE: 12/31/23   Seated hip march alt LE x 10 reps 2#  Seated knee ext alt LE x 10 reps 2#  Seated heel raises BLE x 10 reps x 2 Seated toe raises BLE x 10 reps x 2  Seated hip swings up/over cone 2# x 10 reps each Seated hamstring curl BTB x 12 reps each LE     PATIENT EDUCATION: Education details: Importance of HEP compliance in achieving best outcomes from PT.  Person educated: Patient Education method: Explanation Education comprehension: verbalized understanding  HOME EXERCISE PROGRAM: Access Code: [email or web address] URL: [email or web address] Date: 12/25/2023 Prepared by: Peggye Linear  Exercises - Seated Long Arc Quad  - 1 x daily - 7 x weekly - 2 sets - 10 reps - 1 sec hold - Seated March  - 1 x daily - 7 x weekly - 2 sets - 10 reps - Sit to Stand  - 1 x daily - 7 x weekly - 2 sets - 8 reps - Heel Raises with Counter Support  - 1 x daily - 7 x weekly - 2 sets - 15 reps  GOALS: Goals reviewed with patient? Yes  SHORT TERM GOALS: Target date: 12/11/2023 Pt will  be independent with HEP in order to demonstrate increased ability to perform tasks related to occupation/hobbies. Baseline:  Issued 5/30, not performing anymore.  Goal status: NOT MET   LONG TERM GOALS: Target date: 02/05/2024 1.  Patient (> 23 years old) will complete five times sit to stand test in < 15 seconds indicating an increased LE strength and improved balance. Baseline: 24.89 sec Goal status: INITIAL  2.  Patient will improve the LEFS by 11% in order to demonstrate functional independence without pain. Baseline: 20/80; 25%  Goal status: INITIAL   3.  Patient will increase Berg Balance score by > 6 points to demonstrate decreased fall risk during  functional activities. Baseline: 42 Goal status: INITIAL   3.  Patient will reduce timed up and go to <11 seconds to reduce fall risk and demonstrate improved transfer/gait ability. Baseline: 26.98 sec with no AD Goal status: INITIAL  4.  Patient will increase 10 meter walk test to >1.37m/s as to improve gait speed for better community ambulation and to reduce fall risk. Baseline: 29.08 with no AD; 20.65 with cane Goal status: INITIAL  5.  Patient will increase six minute walk test distance to >1000 for progression to community ambulator and improve gait ability Baseline: 465 ft with SPC  Goal status: INITIAL   ASSESSMENT:  CLINICAL IMPRESSION: Treatment limited by time as patient was a few min late and needed to leave early. She was able to perform most therex without pain today- only reported fatigue. Will plan to resume more balance activities next week if Right knee pain okay from recent injection. Pt will continue to benefit from skilled physical therapy intervention to address impairments, improve QOL, and attain therapy goals.    OBJECTIVE IMPAIRMENTS: Abnormal gait, decreased activity tolerance, decreased balance, decreased endurance, decreased mobility, difficulty walking, decreased strength, and pain.  ACTIVITY LIMITATIONS: carrying, lifting, bending, sitting, standing, squatting, stairs, transfers, and locomotion level PARTICIPATION LIMITATIONS: meal prep, cleaning, laundry, driving, shopping, community activity, and yard work PERSONAL FACTORS: Age, Fitness, Past/current experiences, Time since onset of injury/illness/exacerbation, and 3+ comorbidities: HTN, CVA, Afib, DJD, R knee pain are also affecting patient's functional outcome.  REHAB POTENTIAL: Good CLINICAL DECISION MAKING: Evolving/moderate complexity EVALUATION COMPLEXITY: Moderate  PLAN:  PT FREQUENCY: 2x/week  PT DURATION: 12 weeks  PLANNED INTERVENTIONS: 97750- Physical Performance Testing,  97110-Therapeutic exercises, 97530- Therapeutic activity, 97112- Neuromuscular re-education, 97535- Self Care, [id number]- Manual therapy, [phone]- Gait training, Balance training, Stair training, Dry Needling, Joint mobilization, Joint manipulation, Spinal manipulation, Spinal mobilization, Vestibular training, Cryotherapy, and Moist heat  PLAN FOR NEXT SESSION:  Dynamic balance progression  Progression of LE strengthening and endurance based activities    10:48 AM, 12/31/23 Chyrl London, PT Physical Therapist - [place] [facility]  Outpatient Physical Therapy- [facility] [phone]

## 2024-01-03 ENCOUNTER — Encounter: Admitting: Speech Pathology

## 2024-01-07 ENCOUNTER — Encounter: Admitting: Speech Pathology

## 2024-01-07 ENCOUNTER — Ambulatory Visit: Admitting: Physical Therapy

## 2024-01-07 DIAGNOSIS — R2689 Other abnormalities of gait and mobility: Secondary | ICD-10-CM

## 2024-01-07 DIAGNOSIS — R262 Difficulty in walking, not elsewhere classified: Secondary | ICD-10-CM | POA: Diagnosis not present

## 2024-01-07 DIAGNOSIS — M6281 Muscle weakness (generalized): Secondary | ICD-10-CM

## 2024-01-07 DIAGNOSIS — G8929 Other chronic pain: Secondary | ICD-10-CM

## 2024-01-07 NOTE — Therapy (Addendum)
 OUTPATIENT PHYSICAL THERAPY TREATMENT/Physical Therapy Progress Note   Dates of reporting period  11/13/23   to   01/07/24   Patient Name: Pamela Garner MRN: [id number] DOB:Sep 14, 1940, 83 y.o., female Today's Date: 01/07/2024  PCP: Fernande Ophelia JINNY DOUGLAS, MD  REFERRING PROVIDER: Fernande Ophelia JINNY DOUGLAS, MD  END OF SESSION:  PT End of Session - 01/07/24 1712     Visit Number 10    Number of Visits 25    Date for PT Re-Evaluation 02/05/24    Authorization Type [employer] Medicare    Progress Note Due on Visit 10    PT Start Time 1534    PT Stop Time 1614    PT Time Calculation (min) 40 min    Equipment Utilized During Treatment Gait belt    Activity Tolerance Patient tolerated treatment well;No increased pain    Behavior During Therapy WFL for tasks assessed/performed           Past Medical History:  Diagnosis Date   Degenerative joint disease    Dermatophytosis, nail    feet   Hyperlipidemia    Hypertension    Lichen sclerosus    Thyroid disease    Trigeminal neuralgia of left side of face    Past Surgical History:  Procedure Laterality Date   ATRIAL FIBRILLATION ABLATION N/A 09/20/2023   Procedure: ATRIAL FIBRILLATION ABLATION;  Surgeon: Kennyth Chew, MD;  Location: [facility] INVASIVE CV LAB;  Service: Cardiovascular;  Laterality: N/A;   COLONOSCOPY  2008   DILATION AND CURETTAGE OF UTERUS  1985   endometrial polyps   Patient Active Problem List   Diagnosis Date Noted   New onset atrial flutter (HCC) 06/13/2023   Diplopia 06/12/2023   CVA (cerebral vascular accident) (HCC) 06/12/2023   Atrial fibrillation (HCC) 06/12/2023   GERD (gastroesophageal reflux disease) 10/14/2019   RBBB (right bundle branch block) 10/14/2019   Lichen sclerosus et atrophicus of the vulva 06/13/2017   Thyroid disease    Hypertension    Hyperlipidemia    Degenerative joint disease    ONSET DATE: 06/10/23  REFERRING DIAG: I63.9 (ICD-10-CM) - CVA (cerebral vascular accident) (HCC)  THERAPY DIAG:   No diagnosis found.  Rationale for Evaluation and Treatment: Rehabilitation  SUBJECTIVE:                                                                                                                                                                                            SUBJECTIVE STATEMENT: Pt reports continued discomfort in the knee. No falls or LOB.   PERTINENT HISTORY: 82yoF referred OPPT s/p CVA and falls. PMH: obesity, hypertension, hyperlipidemia, thyroid disease,  CVA and new onset atrial fibrillation.   PAIN:  Are you having pain? Low back pain improving 0/10, Rt knee pain about the same 4/10  PRECAUTIONS: Fall  WEIGHT BEARING RESTRICTIONS: No  FALLS: Has patient fallen in last 6 months? Yes. Number of falls 3  LIVING ENVIRONMENT: Lives with: lives with their spouse Lives in: House/apartment Stairs: Yes: External: 3 steps; on right going up Has following equipment at home: Single point cane and Walker - 2 wheeled  PLOF: Independent  PATIENT GOALS: Pt is most concerned with the Rt knee and working it too much.   OBJECTIVE: Note: Objective measures were completed at Evaluation unless otherwise noted.  DIAGNOSTIC FINDINGS:  EXAM: MRI HEAD WITHOUT CONTRAST  IMPRESSION: Moderate size acute infarct in the right occipital parietal cortex.   STAIRS: Findings: Level of Assistance: Modified independence, Stair Negotiation Technique: Step to Pattern Backwards Forwards With use of AD: cane but not utilized at all with Single Rail on Right, Number of Stairs: 4, Height of Stairs: 6   , and Comments: Pt descends stairs backwards due to reduced pain when performing this way.  FUNCTIONAL TESTS:  5 times sit to stand: 24.89 sec with UE push up from arm rests Timed up and go (TUG): 26.98 with no AD 6 minute walk test: TBD at next visit 10 meter walk test: 29.08 with no AD, 20.65 with cane Berg Balance Scale: TBD at next visit  PATIENT SURVEYS:  ABC scale  36.25% LEFS 25/80                                                                                                                              TREATMENT DATE: 01/07/24  TE- To improve strength, endurance, mobility, and function of specific targeted muscle groups or improve joint range of motion or improve muscle flexibility Seated hip march alt LE 2 x 10 reps 2.5#  Seated knee ext alt LE 2 x 10 reps 2.5#  Seated hamstring curl BTB x 12 reps each LE  TA- To improve functional movements patterns for everyday tasks  standing heel raises BLE 3 x 10 reps x 2.5#  standing toe raises BLE 2 x 10 reps x 2.5#  Side steps with YTB around ankles x 5 laps at support bar, unable to take large and productive steps  Standing hip ABD to larger range compared to previous exercise x 10 ea LE   Gait Training Gait without AD x 100 ft, cues for larger step on the L to increased stance time on the R Gait in box ( forward, lateral and retro walking) x 2 laps ( x 15 ft ea direction) no overt LOB, improved efficiency with practice.      PATIENT EDUCATION: Education details: Importance of HEP compliance in achieving best outcomes from PT.  Person educated: Patient Education method: Explanation Education comprehension: verbalized understanding  HOME EXERCISE PROGRAM: Access Code: [email or web address] URL: [email or web address] Date: 12/25/2023 Prepared by: Peggye Linear  Exercises - Seated Long Arc Quad  - 1 x daily - 7 x weekly - 2 sets - 10 reps - 1 sec hold - Seated March  - 1 x daily - 7 x weekly - 2 sets - 10 reps - Sit to Stand  - 1 x daily - 7 x weekly - 2 sets - 8 reps - Heel Raises with Counter Support  - 1 x daily - 7 x weekly - 2 sets - 15 reps  GOALS: Goals reviewed with patient? Yes  SHORT TERM GOALS: Target date: 12/11/2023 Pt will be independent with HEP in order to demonstrate increased ability to perform tasks related to occupation/hobbies. Baseline:  7/14: pt completing  irregularly but some Goal status: ONGOING  LONG TERM GOALS: Target date: 02/05/2024 1.  Patient (> 77 years old) will complete five times sit to stand test in < 15 seconds indicating an increased LE strength and improved balance. Baseline: 24.89 sec Goal status: INITIAL  2.  Patient will improve the LEFS by 11% in order to demonstrate functional independence without pain. Baseline: 20/80; 25%  Goal status: INITIAL   3.  Patient will increase Berg Balance score by > 6 points to demonstrate decreased fall risk during functional activities. Baseline: 42 Goal status: INITIAL   3.  Patient will reduce timed up and go to <11 seconds to reduce fall risk and demonstrate improved transfer/gait ability. Baseline: 26.98 sec with no AD Goal status: INITIAL  4.  Patient will increase 10 meter walk test to >1.81m/s as to improve gait speed for better community ambulation and to reduce fall risk. Baseline: 29.08 with no AD; 20.65 with cane Goal status: INITIAL  5.  Patient will increase six minute walk test distance to >1000 for progression to community ambulator and improve gait ability Baseline: 465 ft with SPC  Goal status: INITIAL   ASSESSMENT:  CLINICAL IMPRESSION:  Patient arrived with good motivation for completion of pt activities.  Pt goals for progress note to be assessed next visit. Pt encouraged to work diligently on HEP and to make routine of this to improve her function. Continued with current plan of care as laid out in evaluation and recent prior sessions. Pt remains motivated to advance progress toward goals in order to maximize independence and safety at home. Pt requires high level assistance and cuing for completion of exercises in order to provide adequate level of stimulation challenge while minimizing pain and discomfort when possible. Pt closely monitored throughout session pt response and to maximize patient safety during interventions. Pt continues to demonstrate progress  toward goals AEB progression of interventions this date either in volume or intensity.  OBJECTIVE IMPAIRMENTS: Abnormal gait, decreased activity tolerance, decreased balance, decreased endurance, decreased mobility, difficulty walking, decreased strength, and pain.  ACTIVITY LIMITATIONS: carrying, lifting, bending, sitting, standing, squatting, stairs, transfers, and locomotion level PARTICIPATION LIMITATIONS: meal prep, cleaning, laundry, driving, shopping, community activity, and yard work PERSONAL FACTORS: Age, Fitness, Past/current experiences, Time since onset of injury/illness/exacerbation, and 3+ comorbidities: HTN, CVA, Afib, DJD, R knee pain are also affecting patient's functional outcome.  REHAB POTENTIAL: Good CLINICAL DECISION MAKING: Evolving/moderate complexity EVALUATION COMPLEXITY: Moderate  PLAN:  PT FREQUENCY: 2x/week  PT DURATION: 12 weeks  PLANNED INTERVENTIONS: 97750- Physical Performance Testing, 97110-Therapeutic exercises, 97530- Therapeutic activity, 97112- Neuromuscular re-education, 97535- Self Care, [id number]- Manual therapy, [phone]- Gait training, Balance training, Stair training, Dry Needling, Joint mobilization, Joint manipulation, Spinal manipulation, Spinal mobilization, Vestibular training, Cryotherapy, and Moist heat  PLAN FOR  NEXT SESSION:  Dynamic balance progression  Progression of LE strengthening and endurance based activities   Goal assessment  5:13 PM, 01/07/24 Note: Portions of this document were prepared using Dragon voice recognition software and although reviewed may contain unintentional dictation errors in syntax, grammar, or spelling.  Lonni KATHEE Gainer PT ,DPT Physical Therapist- [place]  [facility]

## 2024-01-10 ENCOUNTER — Ambulatory Visit

## 2024-01-10 ENCOUNTER — Encounter: Admitting: Speech Pathology

## 2024-01-10 DIAGNOSIS — M6281 Muscle weakness (generalized): Secondary | ICD-10-CM

## 2024-01-10 DIAGNOSIS — R2689 Other abnormalities of gait and mobility: Secondary | ICD-10-CM

## 2024-01-10 DIAGNOSIS — G8929 Other chronic pain: Secondary | ICD-10-CM

## 2024-01-10 DIAGNOSIS — R262 Difficulty in walking, not elsewhere classified: Secondary | ICD-10-CM | POA: Diagnosis not present

## 2024-01-10 NOTE — Therapy (Signed)
 v OUTPATIENT PHYSICAL THERAPY TREATMENT  Patient Name: Pamela Garner MRN: [id number] DOB:11-21-1940, 83 y.o., female Today's Date: 01/10/2024  PCP: Fernande Ophelia JINNY DOUGLAS, MD  REFERRING PROVIDER: Fernande Ophelia JINNY DOUGLAS, MD  END OF SESSION:  PT End of Session - 01/10/24 1617     Visit Number 11    Number of Visits 25    Date for PT Re-Evaluation 02/05/24    Authorization Type [employer] Medicare    Progress Note Due on Visit 10    PT Start Time 1617    PT Stop Time 1659    PT Time Calculation (min) 42 min    Equipment Utilized During Treatment Gait belt    Activity Tolerance Patient tolerated treatment well;No increased pain    Behavior During Therapy WFL for tasks assessed/performed           Past Medical History:  Diagnosis Date   Degenerative joint disease    Dermatophytosis, nail    feet   Hyperlipidemia    Hypertension    Lichen sclerosus    Thyroid disease    Trigeminal neuralgia of left side of face    Past Surgical History:  Procedure Laterality Date   ATRIAL FIBRILLATION ABLATION N/A 09/20/2023   Procedure: ATRIAL FIBRILLATION ABLATION;  Surgeon: Kennyth Chew, MD;  Location: [facility] INVASIVE CV LAB;  Service: Cardiovascular;  Laterality: N/A;   COLONOSCOPY  2008   DILATION AND CURETTAGE OF UTERUS  1985   endometrial polyps   Patient Active Problem List   Diagnosis Date Noted   New onset atrial flutter (HCC) 06/13/2023   Diplopia 06/12/2023   CVA (cerebral vascular accident) (HCC) 06/12/2023   Atrial fibrillation (HCC) 06/12/2023   GERD (gastroesophageal reflux disease) 10/14/2019   RBBB (right bundle branch block) 10/14/2019   Lichen sclerosus et atrophicus of the vulva 06/13/2017   Thyroid disease    Hypertension    Hyperlipidemia    Degenerative joint disease    ONSET DATE: 06/10/23  REFERRING DIAG: I63.9 (ICD-10-CM) - CVA (cerebral vascular accident) (HCC)  THERAPY DIAG:  Difficulty in walking, not elsewhere classified  Imbalance  Muscle weakness  (generalized)  Chronic pain of right knee  Rationale for Evaluation and Treatment: Rehabilitation  SUBJECTIVE:                                                                                                                                                                                            SUBJECTIVE STATEMENT: Pt reports she is feeling good today. No changes since previous session. Pt reports that she has not felt much relief in her knee since receiving the injection.  PERTINENT HISTORY: 82yoF referred OPPT s/p CVA and falls. PMH: obesity, hypertension, hyperlipidemia, thyroid disease, CVA and new onset atrial fibrillation.   PAIN:  Are you having pain? Low back pain improving 0/10, Rt knee pain about the same 4/10  PRECAUTIONS: Fall  WEIGHT BEARING RESTRICTIONS: No  FALLS: Has patient fallen in last 6 months? Yes. Number of falls 3  LIVING ENVIRONMENT: Lives with: lives with their spouse Lives in: House/apartment Stairs: Yes: External: 3 steps; on right going up Has following equipment at home: Single point cane and Walker - 2 wheeled  PLOF: Independent  PATIENT GOALS: Pt is most concerned with the Rt knee and working it too much.   OBJECTIVE: Note: Objective measures were completed at Evaluation unless otherwise noted.  DIAGNOSTIC FINDINGS:  EXAM: MRI HEAD WITHOUT CONTRAST  IMPRESSION: Moderate size acute infarct in the right occipital parietal cortex.   STAIRS: Findings: Level of Assistance: Modified independence, Stair Negotiation Technique: Step to Pattern Backwards Forwards With use of AD: cane but not utilized at all with Single Rail on Right, Number of Stairs: 4, Height of Stairs: 6   , and Comments: Pt descends stairs backwards due to reduced pain when performing this way.  FUNCTIONAL TESTS:  5 times sit to stand: 24.89 sec with UE push up from arm rests Timed up and go (TUG): 26.98 with no AD 6 minute walk test: TBD at next visit 10 meter walk  test: 29.08 with no AD, 20.65 with cane Berg Balance Scale: TBD at next visit  PATIENT SURVEYS:  ABC scale 36.25% LEFS 25/80                                                                                                                              TREATMENT DATE: 01/10/24  Physical Performance Measures: 6 Min Walk Test:  Instructed patient to ambulate as quickly and as safely as possible for 6 minutes using LRAD. Patient was allowed to take standing rest breaks without stopping the test, but if the patient required a sitting rest break the clock would be stopped and the test would be over.  Results: 483 feet (147.218 meters) using a SPC with CGA. Results indicate that the patient has reduced endurance with ambulation compared to age matched norms.  Age Matched Norms: 22-69 yo M: 23 F: 56, 19-79 yo M: 15 F: 471, 30-89 yo M: 417 F: 392 MDC: 58.21 meters (190.98 feet) or 50 meters (ANPTA Core Set of Outcome Measures for Adults with Neurologic Conditions, 2018)  5xSTS: Pt performed 5 time sit<>stand (5xSTS): 24.52 sec (>15 sec indicates increased fall risk) with UE support on arm rests to achieve standing and control descent to sitting  TUG: PT instructed pt in TUG: 19.53 sec ( >13.5 sec indicates increased fall risk) with no AD  BERG Balance Scale:   [facility] PT Assessment - 01/10/24 0001       Berg Balance Test   Sit to Stand Able to  stand without using hands and stabilize independently    Standing Unsupported Able to stand safely 2 minutes    Sitting with Back Unsupported but Feet Supported on Floor or Stool Able to sit safely and securely 2 minutes    Stand to Sit Controls descent by using hands    Transfers Able to transfer safely, definite need of hands    Standing Unsupported with Eyes Closed Able to stand 10 seconds safely    Standing Unsupported with Feet Together Able to place feet together independently but unable to hold for 30 seconds    From Standing, Reach Forward with  Outstretched Arm Can reach forward >12 cm safely (5)    From Standing Position, Pick up Object from Floor Able to pick up shoe, needs supervision    From Standing Position, Turn to Look Behind Over each Shoulder Looks behind from both sides and weight shifts well    Turn 360 Degrees Able to turn 360 degrees safely but slowly    Standing Unsupported, Alternately Place Feet on Step/Stool Able to complete >2 steps/needs minimal assist    Standing Unsupported, One Foot in Front Able to plae foot ahead of the other independently and hold 30 seconds    Standing on One Leg Tries to lift leg/unable to hold 3 seconds but remains standing independently    Total Score 41              PATIENT EDUCATION: Education details: Importance of HEP compliance in achieving best outcomes from PT.  Person educated: Patient Education method: Explanation Education comprehension: verbalized understanding  HOME EXERCISE PROGRAM: Access Code: [email or web address] URL: [email or web address] Date: 12/25/2023 Prepared by: Peggye Linear  Exercises - Seated Long Arc Quad  - 1 x daily - 7 x weekly - 2 sets - 10 reps - 1 sec hold - Seated March  - 1 x daily - 7 x weekly - 2 sets - 10 reps - Sit to Stand  - 1 x daily - 7 x weekly - 2 sets - 8 reps - Heel Raises with Counter Support  - 1 x daily - 7 x weekly - 2 sets - 15 reps  GOALS: Goals reviewed with patient? Yes  SHORT TERM GOALS: Target date: 12/11/2023 Pt will be independent with HEP in order to demonstrate increased ability to perform tasks related to occupation/hobbies. Baseline:  Issued 5/30, not performing anymore.  Goal status: NOT MET   LONG TERM GOALS: Target date: 02/05/2024 1.  Patient (> 83 years old) will complete five times sit to stand test in < 15 seconds indicating an increased LE strength and improved balance. Baseline: 24.89 sec 7/17: 24.52 seconds with UE support on arm rests Goal status: INITIAL  2.  Patient will improve the LEFS  by 11% in order to demonstrate functional independence without pain. Baseline: 20/80; 25%  7/17: 18% Goal status: INITIAL   3.  Patient will increase Berg Balance score by > 6 points to demonstrate decreased fall risk during functional activities. Baseline: 42 7/17: 41 Goal status: INITIAL   3.  Patient will reduce timed up and go to <11 seconds to reduce fall risk and demonstrate improved transfer/gait ability. Baseline: 26.98 sec with no AD 7/17: 19.53 seconds with no AD Goal status: INITIAL  4.  Patient will increase 10 meter walk test to >1.73m/s as to improve gait speed for better community ambulation and to reduce fall risk. Baseline: 29.08 with no AD; 20.65 with cane 7/17: assess next session Goal status:  INITIAL  5.  Patient will increase six minute walk test distance to >1000 for progression to community ambulator and improve gait ability Baseline: 465 ft with [facility]  7/17: 483 ft with SPC Goal status: INITIAL   ASSESSMENT:  CLINICAL IMPRESSION: PT goals assessed during session today. Pt able to complete all assessments without a reported increase in pain. Pt has made improvements in distance and TUG, but regressed slightly with BERG score and LEFS. Pt remains highly motivated to participate in PT and improve her current condition and mobility status.  Patient's condition has the potential to improve in response to therapy. Maximum improvement is yet to be obtained. The anticipated improvement is attainable and reasonable in a generally predictable time.  Pt will continue to benefit from skilled physical therapy intervention to address impairments, improve QOL, and attain therapy goals.    OBJECTIVE IMPAIRMENTS: Abnormal gait, decreased activity tolerance, decreased balance, decreased endurance, decreased mobility, difficulty walking, decreased strength, and pain.  ACTIVITY LIMITATIONS: carrying, lifting, bending, sitting, standing, squatting, stairs, transfers, and  locomotion level PARTICIPATION LIMITATIONS: meal prep, cleaning, laundry, driving, shopping, community activity, and yard work PERSONAL FACTORS: Age, Fitness, Past/current experiences, Time since onset of injury/illness/exacerbation, and 3+ comorbidities: HTN, CVA, Afib, DJD, R knee pain are also affecting patient's functional outcome.  REHAB POTENTIAL: Good CLINICAL DECISION MAKING: Evolving/moderate complexity EVALUATION COMPLEXITY: Moderate  PLAN:  PT FREQUENCY: 2x/week  PT DURATION: 12 weeks  PLANNED INTERVENTIONS: 97750- Physical Performance Testing, 97110-Therapeutic exercises, 97530- Therapeutic activity, 97112- Neuromuscular re-education, 97535- Self Care, [id number]- Manual therapy, [phone]- Gait training, Balance training, Stair training, Dry Needling, Joint mobilization, Joint manipulation, Spinal manipulation, Spinal mobilization, Vestibular training, Cryotherapy, and Moist heat  PLAN FOR NEXT SESSION:  Dynamic balance progression  Progression of LE strengthening and endurance based activities   Assess  Janell Axe, SPT  5:25 PM, 01/10/24

## 2024-01-14 ENCOUNTER — Ambulatory Visit

## 2024-01-14 DIAGNOSIS — R262 Difficulty in walking, not elsewhere classified: Secondary | ICD-10-CM

## 2024-01-14 DIAGNOSIS — R2689 Other abnormalities of gait and mobility: Secondary | ICD-10-CM

## 2024-01-14 DIAGNOSIS — G8929 Other chronic pain: Secondary | ICD-10-CM

## 2024-01-14 DIAGNOSIS — M6281 Muscle weakness (generalized): Secondary | ICD-10-CM

## 2024-01-14 NOTE — Therapy (Signed)
 OUTPATIENT PHYSICAL THERAPY TREATMENT  Patient Name: Pamela Garner MRN: [id number] DOB:11-Jul-1940, 83 y.o., female Today's Date: 01/14/2024  PCP: Fernande Ophelia JINNY DOUGLAS, MD  REFERRING PROVIDER: Fernande Ophelia JINNY DOUGLAS, MD  END OF SESSION:  PT End of Session - 01/14/24 1014     Visit Number 12    Number of Visits 25    Date for PT Re-Evaluation 02/05/24    Authorization Type [employer] Medicare    Progress Note Due on Visit 10    PT Start Time 1015    PT Stop Time 1057    PT Time Calculation (min) 42 min    Equipment Utilized During Treatment Gait belt    Activity Tolerance Patient tolerated treatment well;No increased pain    Behavior During Therapy WFL for tasks assessed/performed            Past Medical History:  Diagnosis Date   Degenerative joint disease    Dermatophytosis, nail    feet   Hyperlipidemia    Hypertension    Lichen sclerosus    Thyroid disease    Trigeminal neuralgia of left side of face    Past Surgical History:  Procedure Laterality Date   ATRIAL FIBRILLATION ABLATION N/A 09/20/2023   Procedure: ATRIAL FIBRILLATION ABLATION;  Surgeon: Kennyth Chew, MD;  Location: [facility] INVASIVE CV LAB;  Service: Cardiovascular;  Laterality: N/A;   COLONOSCOPY  2008   DILATION AND CURETTAGE OF UTERUS  1985   endometrial polyps   Patient Active Problem List   Diagnosis Date Noted   New onset atrial flutter (HCC) 06/13/2023   Diplopia 06/12/2023   CVA (cerebral vascular accident) (HCC) 06/12/2023   Atrial fibrillation (HCC) 06/12/2023   GERD (gastroesophageal reflux disease) 10/14/2019   RBBB (right bundle branch block) 10/14/2019   Lichen sclerosus et atrophicus of the vulva 06/13/2017   Thyroid disease    Hypertension    Hyperlipidemia    Degenerative joint disease    ONSET DATE: 06/10/23  REFERRING DIAG: I63.9 (ICD-10-CM) - CVA (cerebral vascular accident) (HCC)  THERAPY DIAG:  Difficulty in walking, not elsewhere classified  Imbalance  Muscle weakness  (generalized)  Chronic pain of right knee  Rationale for Evaluation and Treatment: Rehabilitation  SUBJECTIVE:                                                                                                                                                                                            SUBJECTIVE STATEMENT: Pt report that she was in a lot of pain after previous session, stating that she had to take a muscle relaxer on Friday to help with the pain.  PERTINENT HISTORY: 82yoF referred OPPT s/p CVA and falls. PMH: obesity, hypertension, hyperlipidemia, thyroid disease, CVA and new onset atrial fibrillation.   PAIN:  Are you having pain? Low back pain improving 0/10, Rt knee pain about the same 4/10  PRECAUTIONS: Fall  WEIGHT BEARING RESTRICTIONS: No  FALLS: Has patient fallen in last 6 months? Yes. Number of falls 3  LIVING ENVIRONMENT: Lives with: lives with their spouse Lives in: House/apartment Stairs: Yes: External: 3 steps; on right going up Has following equipment at home: Single point cane and Walker - 2 wheeled  PLOF: Independent  PATIENT GOALS: Pt is most concerned with the Rt knee and working it too much.   OBJECTIVE: Note: Objective measures were completed at Evaluation unless otherwise noted.  DIAGNOSTIC FINDINGS:  EXAM: MRI HEAD WITHOUT CONTRAST  IMPRESSION: Moderate size acute infarct in the right occipital parietal cortex.   STAIRS: Findings: Level of Assistance: Modified independence, Stair Negotiation Technique: Step to Pattern Backwards Forwards With use of AD: cane but not utilized at all with Single Rail on Right, Number of Stairs: 4, Height of Stairs: 6   , and Comments: Pt descends stairs backwards due to reduced pain when performing this way.  FUNCTIONAL TESTS:  5 times sit to stand: 24.89 sec with UE push up from arm rests Timed up and go (TUG): 26.98 with no AD 6 minute walk test: TBD at next visit 10 meter walk test: 29.08 with  no AD, 20.65 with cane Berg Balance Scale: TBD at next visit  PATIENT SURVEYS:  ABC scale 36.25% LEFS 25/80                                                                                                                              TREATMENT DATE: 01/14/24   Ther-ex: Standing at support bar (seated rest break provided between standing exercises): - Hedgehog toe taps 2x10 BLW - Lateral step over half-foam roller x10  - Straight leg abduction x10 BLE Seated: - LAQ with 2# AW donned 2x10 each LE - Marches with 2# AW donned 2x10 each LE - GTB HS curl 2x10 each LE - RTB abduction 2x15 - Adduction squeeze with 3 second isometric x15 Supine- heat pack provided under low back: - LE rotations x15 - Single knee to chest x8 each LE- limited by pain in both knees - SAQ over bolster x15 BLE       PATIENT EDUCATION: Education details: Importance of HEP compliance in achieving best outcomes from PT.  Person educated: Patient Education method: Explanation Education comprehension: verbalized understanding  HOME EXERCISE PROGRAM: Access Code: [email or web address] URL: [email or web address] Date: 12/25/2023 Prepared by: Peggye Linear  Exercises - Seated Long Arc Quad  - 1 x daily - 7 x weekly - 2 sets - 10 reps - 1 sec hold - Seated March  - 1 x daily - 7 x weekly - 2 sets - 10 reps - Sit to Stand  - 1 x daily -  7 x weekly - 2 sets - 8 reps - Heel Raises with Counter Support  - 1 x daily - 7 x weekly - 2 sets - 15 reps  GOALS: Goals reviewed with patient? Yes  SHORT TERM GOALS: Target date: 12/11/2023 Pt will be independent with HEP in order to demonstrate increased ability to perform tasks related to occupation/hobbies. Baseline:  Issued 5/30, not performing anymore.  Goal status: NOT MET   LONG TERM GOALS: Target date: 02/05/2024 1.  Patient (> 33 years old) will complete five times sit to stand test in < 15 seconds indicating an increased LE strength and improved  balance. Baseline: 24.89 sec 7/17: 24.52 seconds with UE support on arm rests Goal status: INITIAL  2.  Patient will improve the LEFS by 11% in order to demonstrate functional independence without pain. Baseline: 20/80; 25%  7/17: 18% Goal status: INITIAL   3.  Patient will increase Berg Balance score by > 6 points to demonstrate decreased fall risk during functional activities. Baseline: 42 7/17: 41 Goal status: INITIAL   3.  Patient will reduce timed up and go to <11 seconds to reduce fall risk and demonstrate improved transfer/gait ability. Baseline: 26.98 sec with no AD 7/17: 19.53 seconds with no AD Goal status: INITIAL  4.  Patient will increase 10 meter walk test to >1.33m/s as to improve gait speed for better community ambulation and to reduce fall risk. Baseline: 29.08 with no AD; 20.65 with cane 7/17: assess next session Goal status: INITIAL  5.  Patient will increase six minute walk test distance to >1000 for progression to community ambulator and improve gait ability Baseline: 465 ft with [facility]  7/17: 483 ft with SPC Goal status: INITIAL   ASSESSMENT:  CLINICAL IMPRESSION: Pt presented with increased pain and fatigue today, plan adjusted to include more non-WB positions and seated ther-ex. Pt responded well to all ther-ex today but overall LE ROM remains limited due to pain. Pt continues to have difficulty with weight bearing tasks due to poor tolerance to stance on R LE. Pt remains motivated to improve her mobility and decrease her pain levels. Pt will continue to benefit from skilled physical therapy intervention to address impairments, improve QOL, and attain therapy goals.    OBJECTIVE IMPAIRMENTS: Abnormal gait, decreased activity tolerance, decreased balance, decreased endurance, decreased mobility, difficulty walking, decreased strength, and pain.  ACTIVITY LIMITATIONS: carrying, lifting, bending, sitting, standing, squatting, stairs, transfers, and locomotion  level PARTICIPATION LIMITATIONS: meal prep, cleaning, laundry, driving, shopping, community activity, and yard work PERSONAL FACTORS: Age, Fitness, Past/current experiences, Time since onset of injury/illness/exacerbation, and 3+ comorbidities: HTN, CVA, Afib, DJD, R knee pain are also affecting patient's functional outcome.  REHAB POTENTIAL: Good CLINICAL DECISION MAKING: Evolving/moderate complexity EVALUATION COMPLEXITY: Moderate  PLAN:  PT FREQUENCY: 2x/week  PT DURATION: 12 weeks  PLANNED INTERVENTIONS: 97750- Physical Performance Testing, 97110-Therapeutic exercises, 97530- Therapeutic activity, 97112- Neuromuscular re-education, 97535- Self Care, [id number]- Manual therapy, [phone]- Gait training, Balance training, Stair training, Dry Needling, Joint mobilization, Joint manipulation, Spinal manipulation, Spinal mobilization, Vestibular training, Cryotherapy, and Moist heat  PLAN FOR NEXT SESSION:  Dynamic balance progression  Progression of LE strengthening and endurance based activities     Janell Axe, SPT  This entire session was performed under direct supervision and direction of a licensed Estate agent . I have personally read, edited and approve of the note as written.  Marina  Leopoldo, PT, DPT Physical Therapist - [facility]  Outpatient Physical  Therapy- Main Campus [phone]     12:49 PM, 01/14/24

## 2024-01-17 ENCOUNTER — Encounter: Admitting: Speech Pathology

## 2024-01-17 ENCOUNTER — Ambulatory Visit: Admitting: Physical Therapy

## 2024-01-17 DIAGNOSIS — G8929 Other chronic pain: Secondary | ICD-10-CM

## 2024-01-17 DIAGNOSIS — R262 Difficulty in walking, not elsewhere classified: Secondary | ICD-10-CM

## 2024-01-17 DIAGNOSIS — R2689 Other abnormalities of gait and mobility: Secondary | ICD-10-CM

## 2024-01-17 DIAGNOSIS — M6281 Muscle weakness (generalized): Secondary | ICD-10-CM

## 2024-01-17 NOTE — Therapy (Signed)
 OUTPATIENT PHYSICAL THERAPY TREATMENT  Patient Name: Pamela Garner MRN: [id number] DOB:1941/02/03, 83 y.o., female Today's Date: 01/17/2024  PCP: Fernande Ophelia JINNY DOUGLAS, MD  REFERRING PROVIDER: Fernande Ophelia JINNY DOUGLAS, MD  END OF SESSION:  PT End of Session - 01/17/24 1535     Visit Number 13    Number of Visits 25    Date for PT Re-Evaluation 02/05/24    Authorization Type [employer] Medicare    Progress Note Due on Visit 10    PT Start Time 1535    PT Stop Time 1615    PT Time Calculation (min) 40 min    Equipment Utilized During Treatment Gait belt    Activity Tolerance Patient tolerated treatment well;No increased pain    Behavior During Therapy WFL for tasks assessed/performed            Past Medical History:  Diagnosis Date   Degenerative joint disease    Dermatophytosis, nail    feet   Hyperlipidemia    Hypertension    Lichen sclerosus    Thyroid disease    Trigeminal neuralgia of left side of face    Past Surgical History:  Procedure Laterality Date   ATRIAL FIBRILLATION ABLATION N/A 09/20/2023   Procedure: ATRIAL FIBRILLATION ABLATION;  Surgeon: Kennyth Chew, MD;  Location: [facility] INVASIVE CV LAB;  Service: Cardiovascular;  Laterality: N/A;   COLONOSCOPY  2008   DILATION AND CURETTAGE OF UTERUS  1985   endometrial polyps   Patient Active Problem List   Diagnosis Date Noted   New onset atrial flutter (HCC) 06/13/2023   Diplopia 06/12/2023   CVA (cerebral vascular accident) (HCC) 06/12/2023   Atrial fibrillation (HCC) 06/12/2023   GERD (gastroesophageal reflux disease) 10/14/2019   RBBB (right bundle branch block) 10/14/2019   Lichen sclerosus et atrophicus of the vulva 06/13/2017   Thyroid disease    Hypertension    Hyperlipidemia    Degenerative joint disease    ONSET DATE: 06/10/23  REFERRING DIAG: I63.9 (ICD-10-CM) - CVA (cerebral vascular accident) (HCC)  THERAPY DIAG:  Difficulty in walking, not elsewhere classified  Imbalance  Muscle weakness  (generalized)  Chronic pain of right knee  Rationale for Evaluation and Treatment: Rehabilitation  SUBJECTIVE:                                                                                                                                                                                            SUBJECTIVE STATEMENT:  Pt continues to report increased pain in Bil hips following interventions for gluteal strengthening. Is still taking tylenol  and muscle relaxers for pain management In the hips.  PERTINENT HISTORY: 82yoF referred OPPT s/p CVA and falls. PMH: obesity, hypertension, hyperlipidemia, thyroid disease, CVA and new onset atrial fibrillation.   PAIN:  Are you having pain? Low back pain improving 0/10, Rt knee pain about the same 4/10  PRECAUTIONS: Fall  WEIGHT BEARING RESTRICTIONS: No  FALLS: Has patient fallen in last 6 months? Yes. Number of falls 3  LIVING ENVIRONMENT: Lives with: lives with their spouse Lives in: House/apartment Stairs: Yes: External: 3 steps; on right going up Has following equipment at home: Single point cane and Walker - 2 wheeled  PLOF: Independent  PATIENT GOALS: Pt is most concerned with the Rt knee and working it too much.   OBJECTIVE: Note: Objective measures were completed at Evaluation unless otherwise noted.  DIAGNOSTIC FINDINGS:  EXAM: MRI HEAD WITHOUT CONTRAST  IMPRESSION: Moderate size acute infarct in the right occipital parietal cortex.   STAIRS: Findings: Level of Assistance: Modified independence, Stair Negotiation Technique: Step to Pattern Backwards Forwards With use of AD: cane but not utilized at all with Single Rail on Right, Number of Stairs: 4, Height of Stairs: 6   , and Comments: Pt descends stairs backwards due to reduced pain when performing this way.  FUNCTIONAL TESTS:  5 times sit to stand: 24.89 sec with UE push up from arm rests Timed up and go (TUG): 26.98 with no AD 6 minute walk test: TBD at next  visit 10 meter walk test: 29.08 with no AD, 20.65 with cane Berg Balance Scale: TBD at next visit  PATIENT SURVEYS:  ABC scale 36.25% LEFS 25/80                                                                                                                              TREATMENT DATE: 01/17/24  Gait with SPC x 159ft. Initially reporting pain and shuffle gait pattern, but noted to increase step length.   Nustep AAROM  level 1; 3 min x 2 with therapeutic rest breaks between bouts. Reports pain with end range flexion on the RLE initially, but improved range and decreased pain upon completion per pt report.   Sit<>stand 2x 5 with UE supported from arm rest; mild pain in the thenar eminence; but no significant pain reported in knees.   PT performed manual therapy for pain management and improved joint mobility in the R knee.  STM to quads with multiple TrP release for 1 min each as well as cross frictional massage for TrP management  AP and PA grad 2-3 joint mobs in ~ 80 deg extension x 45 sec each    Additional gait with SPC x 7ft with CGA for safety.   Standing at rail:  HS curl x 10 bil  March x 10 bil  Partial hip abduction x 5 bil. With mild soreness reported in R hip.     PATIENT EDUCATION: Education details: Importance of HEP compliance in achieving best outcomes from PT.  Person educated: Patient Education  method: Explanation Education comprehension: verbalized understanding  HOME EXERCISE PROGRAM: Access Code: [email or web address] URL: [email or web address] Date: 12/25/2023 Prepared by: Peggye Linear  Exercises - Seated Long Arc Quad  - 1 x daily - 7 x weekly - 2 sets - 10 reps - 1 sec hold - Seated March  - 1 x daily - 7 x weekly - 2 sets - 10 reps - Sit to Stand  - 1 x daily - 7 x weekly - 2 sets - 8 reps - Heel Raises with Counter Support  - 1 x daily - 7 x weekly - 2 sets - 15 reps  GOALS: Goals reviewed with patient? Yes  SHORT TERM GOALS: Target date:  12/11/2023 Pt will be independent with HEP in order to demonstrate increased ability to perform tasks related to occupation/hobbies. Baseline:  Issued 5/30, not performing anymore.  Goal status: NOT MET   LONG TERM GOALS: Target date: 02/05/2024 1.  Patient (> 72 years old) will complete five times sit to stand test in < 15 seconds indicating an increased LE strength and improved balance. Baseline: 24.89 sec 7/17: 24.52 seconds with UE support on arm rests Goal status: INITIAL  2.  Patient will improve the LEFS by 11% in order to demonstrate functional independence without pain. Baseline: 20/80; 25%  7/17: 18% Goal status: INITIAL   3.  Patient will increase Berg Balance score by > 6 points to demonstrate decreased fall risk during functional activities. Baseline: 42 7/17: 41 Goal status: INITIAL   3.  Patient will reduce timed up and go to <11 seconds to reduce fall risk and demonstrate improved transfer/gait ability. Baseline: 26.98 sec with no AD 7/17: 19.53 seconds with no AD Goal status: INITIAL  4.  Patient will increase 10 meter walk test to >1.60m/s as to improve gait speed for better community ambulation and to reduce fall risk. Baseline: 29.08 with no AD; 20.65 with cane 7/17: assess next session Goal status: INITIAL  5.  Patient will increase six minute walk test distance to >1000 for progression to community ambulator and improve gait ability Baseline: 465 ft with [facility]  7/17: 483 ft with SPC Goal status: INITIAL   ASSESSMENT:  CLINICAL IMPRESSION: Pt presented with continued pain and fatigue today. Plan adjusted for pain management to improve muscle extensibility with reduction of severity of TrP. AAROM also performed with use of new step for short bouts in pain free ROM. Continued standing therex in open chain for BLE, but notes mild hip upon completion.  Pt remains motivated to improve her mobility and decrease her pain levels. Pt will continue to benefit from  skilled physical therapy intervention to address impairments, improve QOL, and attain therapy goals.    OBJECTIVE IMPAIRMENTS: Abnormal gait, decreased activity tolerance, decreased balance, decreased endurance, decreased mobility, difficulty walking, decreased strength, and pain.  ACTIVITY LIMITATIONS: carrying, lifting, bending, sitting, standing, squatting, stairs, transfers, and locomotion level PARTICIPATION LIMITATIONS: meal prep, cleaning, laundry, driving, shopping, community activity, and yard work PERSONAL FACTORS: Age, Fitness, Past/current experiences, Time since onset of injury/illness/exacerbation, and 3+ comorbidities: HTN, CVA, Afib, DJD, R knee pain are also affecting patient's functional outcome.  REHAB POTENTIAL: Good CLINICAL DECISION MAKING: Evolving/moderate complexity EVALUATION COMPLEXITY: Moderate  PLAN:  PT FREQUENCY: 2x/week  PT DURATION: 12 weeks  PLANNED INTERVENTIONS: 97750- Physical Performance Testing, 97110-Therapeutic exercises, 97530- Therapeutic activity, 97112- Neuromuscular re-education, 97535- Self Care, [id number]- Manual therapy, [phone]- Gait training, Balance training, Stair training, Dry Needling, Joint mobilization, Joint manipulation, Spinal manipulation, Spinal mobilization, Vestibular  training, Cryotherapy, and Moist heat  PLAN FOR NEXT SESSION:  Dynamic balance progression as tolerated with pain BLE strengthening or pain management.   Progression of LE strengthening and endurance based activities     [phone]   Massie Dollar PT, DPT  Physical Therapist - [facility]  3:35 PM 01/17/24    3:35 PM, 01/17/24

## 2024-01-21 ENCOUNTER — Ambulatory Visit: Payer: Self-pay | Admitting: Cardiology

## 2024-01-21 ENCOUNTER — Ambulatory Visit: Admitting: Physical Therapy

## 2024-01-21 ENCOUNTER — Encounter: Admitting: Speech Pathology

## 2024-01-21 DIAGNOSIS — R262 Difficulty in walking, not elsewhere classified: Secondary | ICD-10-CM | POA: Diagnosis not present

## 2024-01-21 DIAGNOSIS — R2689 Other abnormalities of gait and mobility: Secondary | ICD-10-CM

## 2024-01-21 DIAGNOSIS — G8929 Other chronic pain: Secondary | ICD-10-CM

## 2024-01-21 DIAGNOSIS — I480 Paroxysmal atrial fibrillation: Secondary | ICD-10-CM

## 2024-01-21 DIAGNOSIS — I483 Typical atrial flutter: Secondary | ICD-10-CM

## 2024-01-21 DIAGNOSIS — M6281 Muscle weakness (generalized): Secondary | ICD-10-CM

## 2024-01-21 NOTE — Therapy (Signed)
 OUTPATIENT PHYSICAL THERAPY TREATMENT  Patient Name: Pamela Garner MRN: [id number] DOB:15-Jul-1940, 83 y.o., female Today's Date: 01/21/2024  PCP: Fernande Ophelia JINNY DOUGLAS, MD  REFERRING PROVIDER: Fernande Ophelia JINNY DOUGLAS, MD  END OF SESSION:  PT End of Session - 01/21/24 1450     Visit Number 14    Number of Visits 25    Date for PT Re-Evaluation 02/05/24    Authorization Type UHC [phone] for 25 Pt visits from 11/13/2023 - 02/05/2024    Progress Note Due on Visit 20    PT Start Time 1450    PT Stop Time 1531    PT Time Calculation (min) 41 min    Equipment Utilized During Treatment Gait belt    Activity Tolerance Patient tolerated treatment well;No increased pain    Behavior During Therapy WFL for tasks assessed/performed             Past Medical History:  Diagnosis Date   Degenerative joint disease    Dermatophytosis, nail    feet   Hyperlipidemia    Hypertension    Lichen sclerosus    Thyroid disease    Trigeminal neuralgia of left side of face    Past Surgical History:  Procedure Laterality Date   ATRIAL FIBRILLATION ABLATION N/A 09/20/2023   Procedure: ATRIAL FIBRILLATION ABLATION;  Surgeon: Kennyth Chew, MD;  Location: [facility] INVASIVE CV LAB;  Service: Cardiovascular;  Laterality: N/A;   COLONOSCOPY  2008   DILATION AND CURETTAGE OF UTERUS  1985   endometrial polyps   Patient Active Problem List   Diagnosis Date Noted   New onset atrial flutter (HCC) 06/13/2023   Diplopia 06/12/2023   CVA (cerebral vascular accident) (HCC) 06/12/2023   Atrial fibrillation (HCC) 06/12/2023   GERD (gastroesophageal reflux disease) 10/14/2019   RBBB (right bundle branch block) 10/14/2019   Lichen sclerosus et atrophicus of the vulva 06/13/2017   Thyroid disease    Hypertension    Hyperlipidemia    Degenerative joint disease    ONSET DATE: 06/10/23  REFERRING DIAG: I63.9 (ICD-10-CM) - CVA (cerebral vascular accident) (HCC)  THERAPY DIAG:  Difficulty in walking, not  elsewhere classified  Imbalance  Muscle weakness (generalized)  Chronic pain of right knee  Rationale for Evaluation and Treatment: Rehabilitation  SUBJECTIVE:                                                                                                                                                                                            SUBJECTIVE STATEMENT:  Pt reports she had follow-up with MD today (note not yet in patient's chart). Pt states she has been dealing  with swelling in her ankles/feet for a long time, but they are both improved now and pt is happy about this. Reports constant pain in R knee like an ache. Pt states she is unable to have surgery on R knee due to her heart. Pt states she has a big event for [employer] coming up in November (in [place] ) and will have to do a lot of walking at it. Pt states she plans to reach out to Orthopedic MD regarding getting a cortisone injection before this event because the gel injection she received last didn't provide her relief. Denies stumbles/falls. Pt reports she uses her hurricane all the time for ambulation because she is unsteady.    PERTINENT HISTORY: 82yoF referred OPPT s/p CVA and falls. PMH: obesity, hypertension, hyperlipidemia, thyroid disease, CVA and new onset atrial fibrillation.   PAIN:  Are you having pain? Low back pain improving 0/10, Rt knee pain about the same 4/10  PRECAUTIONS: Fall  WEIGHT BEARING RESTRICTIONS: No  FALLS: Has patient fallen in last 6 months? Yes. Number of falls 3  LIVING ENVIRONMENT: Lives with: lives with their spouse Lives in: House/apartment Stairs: Yes: External: 3 steps; on right going up Has following equipment at home: Single point cane and Walker - 2 wheeled  PLOF: Independent  PATIENT GOALS: Pt is most concerned with the Rt knee and working it too much.   OBJECTIVE: Note: Objective measures were completed at Evaluation unless otherwise  noted.  DIAGNOSTIC FINDINGS:  EXAM: MRI HEAD WITHOUT CONTRAST  IMPRESSION: Moderate size acute infarct in the right occipital parietal cortex.   STAIRS: Findings: Level of Assistance: Modified independence, Stair Negotiation Technique: Step to Pattern Backwards Forwards With use of AD: cane but not utilized at all with Single Rail on Right, Number of Stairs: 4, Height of Stairs: 6   , and Comments: Pt descends stairs backwards due to reduced pain when performing this way.  FUNCTIONAL TESTS:  5 times sit to stand: 24.89 sec with UE push up from arm rests Timed up and go (TUG): 26.98 with no AD 6 minute walk test: TBD at next visit 10 meter walk test: 29.08 with no AD, 20.65 with cane Berg Balance Scale: TBD at next visit  PATIENT SURVEYS:  ABC scale 36.25% LEFS 25/80                                                                                                                              TREATMENT DATE: 01/21/24  Pt received in transport chair with her personal hurricane in tow.   R stand pivot transport chair>green chair with UE support on armrest with CGA/light min A for steadying.   Gait training ~164ft using pt's personal hurricane with pt demonstrating incorrect use of AD via using it in her R hand and having the AD too tall for her height causing her to prop it out to the side. Therapist educated pt on 2 point gait  pattern using hurricane in her L UE support with cuing for reciprocal stepping pattern while using L UE support on cane to provide R LE support during stance phase. Pt states this feels awkward because it is different than she has been used to.   Therapist lowered cane height and performed additional ~155ft gait training with therapist providing CGA and pt demos much better placement of AD under her BOS with more normal BOS (not as wide). Pt does continue to have slow gait speed with shorter step lengths, although min reciprocal pattern.  Repeated sit<>stands  from very highly elevated EOM progressed to slightly less high EOM 2x 8reps without UE support Pt denies pain from this height  Standing with B UE support R hip abduction AROM 2x 8 reps This was significantly challenging for patient with impaired stance on L LE and weakness in R hip to perform the movement Pt demonstrates significant bilateral hip weakness  Side stepping along length of mat table 3 laps with B UE support Cuing for increased hip abductor activation to stabilize pelvis and avoid hip drop    PATIENT EDUCATION: Education details: Importance of HEP compliance in achieving best outcomes from PT.  Person educated: Patient Education method: Explanation Education comprehension: verbalized understanding  HOME EXERCISE PROGRAM: Access Code: [email or web address] URL: [email or web address] Date: 12/25/2023 Prepared by: Peggye Linear  Exercises - Seated Long Arc Quad  - 1 x daily - 7 x weekly - 2 sets - 10 reps - 1 sec hold - Seated March  - 1 x daily - 7 x weekly - 2 sets - 10 reps - Sit to Stand  - 1 x daily - 7 x weekly - 2 sets - 8 reps - Heel Raises with Counter Support  - 1 x daily - 7 x weekly - 2 sets - 15 reps  GOALS: Goals reviewed with patient? Yes  SHORT TERM GOALS: Target date: 12/11/2023  Pt will be independent with HEP in order to demonstrate increased ability to perform tasks related to occupation/hobbies. Baseline:  Issued 5/30, not performing anymore.  Goal status: NOT MET   LONG TERM GOALS: Target date: 02/05/2024  1.  Patient (> 7 years old) will complete five times sit to stand test in < 15 seconds indicating an increased LE strength and improved balance. Baseline: 24.89 sec 7/17: 24.52 seconds with UE support on arm rests Goal status: INITIAL  2.  Patient will improve the LEFS by 11% in order to demonstrate functional independence without pain. Baseline: 20/80; 25%  7/17: 18% Goal status: INITIAL   3.  Patient will increase Berg Balance  score by > 6 points to demonstrate decreased fall risk during functional activities. Baseline: 42 7/17: 41 Goal status: INITIAL   3.  Patient will reduce timed up and go to <11 seconds to reduce fall risk and demonstrate improved transfer/gait ability. Baseline: 26.98 sec with no AD 7/17: 19.53 seconds with no AD Goal status: INITIAL  4.  Patient will increase 10 meter walk test to >1.55m/s as to improve gait speed for better community ambulation and to reduce fall risk. Baseline: 29.08 with no AD; 20.65 with cane 7/17: assess next session Goal status: INITIAL  5.  Patient will increase six minute walk test distance to >1000 for progression to community ambulator and improve gait ability Baseline: 465 ft with [facility]  7/17: 483 ft with SPC Goal status: INITIAL   ASSESSMENT:  CLINICAL IMPRESSION:  Pt presents with continued chronic R knee pain impacting her tolerance  to certain WBing exercises; therefore, appropriate modifications made during session. Patient noted to have incorrect use of hurricane as well as improper AD height; therefore, session focused on education and training to patient on proper AD management to support R knee during stance via holding cane in L UE. Patient initially reports this as feeling awkward because it is a new/different technique than what she has been performing. Therapist encourages patient to attempt this corrected 2-point gait pattern using her hurricane until next therapy session and report back how she felt with her pain and balance. Remainder of session focused on B LE functional strengthening as pt demonstrates significant bilateral hip weakness. Pt remains motivated to improve her mobility and decrease her pain levels. Pt will continue to benefit from skilled physical therapy intervention to address impairments, improve QOL, and attain therapy goals.    OBJECTIVE IMPAIRMENTS: Abnormal gait, decreased activity tolerance, decreased balance, decreased  endurance, decreased mobility, difficulty walking, decreased strength, and pain.  ACTIVITY LIMITATIONS: carrying, lifting, bending, sitting, standing, squatting, stairs, transfers, and locomotion level PARTICIPATION LIMITATIONS: meal prep, cleaning, laundry, driving, shopping, community activity, and yard work PERSONAL FACTORS: Age, Fitness, Past/current experiences, Time since onset of injury/illness/exacerbation, and 3+ comorbidities: HTN, CVA, Afib, DJD, R knee pain are also affecting patient's functional outcome.  REHAB POTENTIAL: Good CLINICAL DECISION MAKING: Evolving/moderate complexity EVALUATION COMPLEXITY: Moderate  PLAN:  PT FREQUENCY: 2x/week  PT DURATION: 12 weeks  PLANNED INTERVENTIONS: 97750- Physical Performance Testing, 97110-Therapeutic exercises, 97530- Therapeutic activity, 97112- Neuromuscular re-education, 97535- Self Care, [id number]- Manual therapy, [phone]- Gait training, Balance training, Stair training, Dry Needling, Joint mobilization, Joint manipulation, Spinal manipulation, Spinal mobilization, Vestibular training, Cryotherapy, and Moist heat  PLAN FOR NEXT SESSION:  - follow-up on corrected gait pattern with use of hurricane in L UE  Dynamic balance progression as tolerated with pain BLE strengthening or pain management  - specifically hip extensors, hip abductors, and quads Progression of LE strengthening and endurance based activities    - progress gait endurance to prepare patient for ambulating longer distances at her event in November     Kristy Schomburg, PT, DPT, NCS, CSRS Physical Therapist - [facility]  5:51 PM 01/21/24

## 2024-01-24 ENCOUNTER — Encounter: Admitting: Speech Pathology

## 2024-01-24 ENCOUNTER — Ambulatory Visit: Admitting: Physical Therapy

## 2024-01-24 DIAGNOSIS — R262 Difficulty in walking, not elsewhere classified: Secondary | ICD-10-CM | POA: Diagnosis not present

## 2024-01-24 DIAGNOSIS — M6281 Muscle weakness (generalized): Secondary | ICD-10-CM

## 2024-01-24 DIAGNOSIS — G8929 Other chronic pain: Secondary | ICD-10-CM

## 2024-01-24 DIAGNOSIS — R2689 Other abnormalities of gait and mobility: Secondary | ICD-10-CM

## 2024-01-24 NOTE — Therapy (Signed)
 OUTPATIENT PHYSICAL THERAPY TREATMENT  Patient Name: Pamela Garner MRN: [id number] DOB:August 29, 1940, 83 y.o., female Today's Date: 01/24/2024  PCP: Fernande Ophelia JINNY DOUGLAS, MD  REFERRING PROVIDER: Fernande Ophelia JINNY DOUGLAS, MD  END OF SESSION:   PT End of Session - 01/24/24 1639     Visit Number 15    Number of Visits 25    Date for PT Re-Evaluation 02/05/24    Authorization Type [id number] [id number] 25 Pt visits from 11/13/2023 - 02/05/2024    Progress Note Due on Visit 20    PT Start Time 1635    PT Stop Time 1658    PT Time Calculation (min) 23 min    Equipment Utilized During Treatment Gait belt    Activity Tolerance Patient tolerated treatment well;No increased pain    Behavior During Therapy WFL for tasks assessed/performed              Past Medical History:  Diagnosis Date   Degenerative joint disease    Dermatophytosis, nail    feet   Hyperlipidemia    Hypertension    Lichen sclerosus    Thyroid disease    Trigeminal neuralgia of left side of face    Past Surgical History:  Procedure Laterality Date   ATRIAL FIBRILLATION ABLATION N/A 09/20/2023   Procedure: ATRIAL FIBRILLATION ABLATION;  Surgeon: Kennyth Chew, MD;  Location: [facility] INVASIVE CV LAB;  Service: Cardiovascular;  Laterality: N/A;   COLONOSCOPY  2008   DILATION AND CURETTAGE OF UTERUS  1985   endometrial polyps   Patient Active Problem List   Diagnosis Date Noted   New onset atrial flutter (HCC) 06/13/2023   Diplopia 06/12/2023   CVA (cerebral vascular accident) (HCC) 06/12/2023   Atrial fibrillation (HCC) 06/12/2023   GERD (gastroesophageal reflux disease) 10/14/2019   RBBB (right bundle branch block) 10/14/2019   Lichen sclerosus et atrophicus of the vulva 06/13/2017   Thyroid disease    Hypertension    Hyperlipidemia    Degenerative joint disease    ONSET DATE: 06/10/23  REFERRING DIAG: I63.9 (ICD-10-CM) - CVA (cerebral vascular accident) (HCC)  THERAPY DIAG:  Difficulty in walking, not  elsewhere classified  Imbalance  Muscle weakness (generalized)  Chronic pain of right knee  Rationale for Evaluation and Treatment: Rehabilitation  SUBJECTIVE:                                                                                                                                                                                            SUBJECTIVE STATEMENT:  Pt apologizes for late arrival to therapy, reporting she was waiting on the valet parking. Pt states  overall she is not having a good day. Pt states she was sore by the time she got home after last therapy session whereas in the past she usually doesn't have soreness until the next day. Pt reporting her hips are so stiff. Pt upset she feels she is not getting better, but instead she is getting worse. Pt states she has already had 2 Tylenols today because she didn't want to take her muscle relaxer when driving.   Pt states she really feels the side stepping and lateral kicking causes her hips to feel like they lock up.   From Prior Sessions:  01/21/2024: Reports constant pain in R knee like an ache. Pt states she is unable to have surgery on R knee due to her heart. Pt states she has a big event for [employer] coming up in November (in [place] ) and will have to do a lot of walking at it. Pt states she plans to reach out to Orthopedic MD regarding getting a cortisone injection before this event because the gel injection she received last didn't provide her relief. Pt reports she uses her hurricane all the time for ambulation because she is unsteady.    PERTINENT HISTORY: 82yoF referred OPPT s/p CVA and falls. PMH: obesity, hypertension, hyperlipidemia, thyroid disease, CVA and new onset atrial fibrillation.   PAIN:  Are you having pain? Low back pain improving 0/10, Rt knee pain about the same 4/10  PRECAUTIONS: Fall  WEIGHT BEARING RESTRICTIONS: No  FALLS: Has patient fallen in last 6 months?  Yes. Number of falls 3  LIVING ENVIRONMENT: Lives with: lives with their spouse Lives in: House/apartment Stairs: Yes: External: 3 steps; on right going up Has following equipment at home: Single point cane and Walker - 2 wheeled  PLOF: Independent  PATIENT GOALS: Pt is most concerned with the Rt knee and working it too much.   OBJECTIVE: Note: Objective measures were completed at Evaluation unless otherwise noted.  DIAGNOSTIC FINDINGS:  EXAM: MRI HEAD WITHOUT CONTRAST  IMPRESSION: Moderate size acute infarct in the right occipital parietal cortex.   STAIRS: Findings: Level of Assistance: Modified independence, Stair Negotiation Technique: Step to Pattern Backwards Forwards With use of AD: cane but not utilized at all with Single Rail on Right, Number of Stairs: 4, Height of Stairs: 6   , and Comments: Pt descends stairs backwards due to reduced pain when performing this way.  FUNCTIONAL TESTS:  5 times sit to stand: 24.89 sec with UE push up from arm rests Timed up and go (TUG): 26.98 with no AD 6 minute walk test: TBD at next visit 10 meter walk test: 29.08 with no AD, 20.65 with cane Berg Balance Scale: TBD at next visit  PATIENT SURVEYS:  ABC scale 36.25% LEFS 25/80  TREATMENT DATE: 01/24/24  Pt received in transport chair with her personal hurricane in tow.   Pt states she tried using the hurricane in her L hand, but she feels her L hand is too weak and she doesn't feel stable using it full time. Pt states she will try to use it intermittently, but will often go back to using cane support in R UE.   Pt states she has kept the height of the cane the same since therapist adjusted it.  Sit>stand from transport chair to hurricane in R UE with min A for boosting up into standing.   Gait training ~73ft using hurricane in RUE with CGA for  steadying - pt demos very slow gait speed with short steps and having valgus movement at knees with pt stating she is letting her knees lean on each other to provide her support while walking.   B UE and B LE reciprocal movement pattern on Nustep for B UE & LE functional strengthening and AROM for pain management at the following levels:  Level 1 resistance for 3 minutes Level 2 for 2 minutes Totaling 5 minutes and 175 steps Goal steps per minute (SPM): no specific target set this date   Gait training additional ~41ft using hurricane in L UE support at this time with CGA - pt reports no increase in pain following use of Nustep - continues to demo the above gait deviations as well as minor antalgic gait pattern with L hip during L stance.  Therapy session limited due to time constraints with late arrival from delay in valet parking.   PATIENT EDUCATION: Education details: Importance of HEP compliance in achieving best outcomes from PT.  Person educated: Patient Education method: Explanation Education comprehension: verbalized understanding  HOME EXERCISE PROGRAM: Access Code: [phone] URL: [email or web address] Date: 12/25/2023 Prepared by: Peggye Linear  Exercises - Seated Long Arc Quad  - 1 x daily - 7 x weekly - 2 sets - 10 reps - 1 sec hold - Seated March  - 1 x daily - 7 x weekly - 2 sets - 10 reps - Sit to Stand  - 1 x daily - 7 x weekly - 2 sets - 8 reps - Heel Raises with Counter Support  - 1 x daily - 7 x weekly - 2 sets - 15 reps  GOALS: Goals reviewed with patient? Yes  SHORT TERM GOALS: Target date: 12/11/2023  Pt will be independent with HEP in order to demonstrate increased ability to perform tasks related to occupation/hobbies. Baseline:  Issued 5/30, not performing anymore.  Goal status: NOT MET   LONG TERM GOALS: Target date: 02/05/2024  1.  Patient (> 22 years old) will complete five times sit to stand test in < 15 seconds indicating an  increased LE strength and improved balance. Baseline: 24.89 sec 7/17: 24.52 seconds with UE support on arm rests Goal status: INITIAL  2.  Patient will improve the LEFS by 11% in order to demonstrate functional independence without pain. Baseline: 20/80; 25%  7/17: 18% Goal status: INITIAL   3.  Patient will increase Berg Balance score by > 6 points to demonstrate decreased fall risk during functional activities. Baseline: 42 7/17: 41 Goal status: INITIAL   3.  Patient will reduce timed up and go to <11 seconds to reduce fall risk and demonstrate improved transfer/gait ability. Baseline: 26.98 sec with no AD 7/17: 19.53 seconds with no AD Goal status: INITIAL  4.  Patient will increase 10 meter walk test  to >1.16m/s as to improve gait speed for better community ambulation and to reduce fall risk. Baseline: 29.08 with no AD; 20.65 with cane 7/17: assess next session Goal status: INITIAL  5.  Patient will increase six minute walk test distance to >1000 for progression to community ambulator and improve gait ability Baseline: 465 ft with [facility]  7/17: 483 ft with [facility] Goal status: INITIAL   ASSESSMENT:  CLINICAL IMPRESSION:  Patient with late arrival to therapy session due to reports of delay in valet parking, limiting participation in session. Patient reports having soreness in bilateral hips immediately following last therapy session, which pt states is more severe due to her usually not having discomfort until the following day after therapy. Pt reports feeling discouraged because she feels she is declining rather than improving. Therapy session focused on gait training to follow-up on AD height adjustment and 2-point gait pattern from last visit as well as B UE and B LE reciprocal movement pattern on Nustep for strengthening and pain modulation. Pt remains motivated to improve her mobility and decrease her pain levels. Pt will continue to benefit from skilled physical therapy  intervention to address impairments, improve QOL, and attain therapy goals.    OBJECTIVE IMPAIRMENTS: Abnormal gait, decreased activity tolerance, decreased balance, decreased endurance, decreased mobility, difficulty walking, decreased strength, and pain.  ACTIVITY LIMITATIONS: carrying, lifting, bending, sitting, standing, squatting, stairs, transfers, and locomotion level PARTICIPATION LIMITATIONS: meal prep, cleaning, laundry, driving, shopping, community activity, and yard work PERSONAL FACTORS: Age, Fitness, Past/current experiences, Time since onset of injury/illness/exacerbation, and 3+ comorbidities: HTN, CVA, Afib, DJD, R knee pain are also affecting patient's functional outcome.  REHAB POTENTIAL: Good CLINICAL DECISION MAKING: Evolving/moderate complexity EVALUATION COMPLEXITY: Moderate  PLAN:  PT FREQUENCY: 2x/week  PT DURATION: 12 weeks  PLANNED INTERVENTIONS: 97750- Physical Performance Testing, 97110-Therapeutic exercises, 97530- Therapeutic activity, 97112- Neuromuscular re-education, 97535- Self Care, [id number]- Manual therapy, [phone]- Gait training, Balance training, Stair training, Dry Needling, Joint mobilization, Joint manipulation, Spinal manipulation, Spinal mobilization, Vestibular training, Cryotherapy, and Moist heat  PLAN FOR NEXT SESSION:  - follow-up on corrected gait pattern with use of hurricane in L UE and 2-point pattern Dynamic balance progression as tolerated with pain BLE strengthening or pain management  - specifically hip extensors, hip abductors, and quads - maybe start with more supine level exercises due to limited tolerance with standing exercises? Progression of LE strengthening and endurance based activities    - progress gait endurance to prepare patient for ambulating longer distances at her event in November     Pollyann Roa, PT, DPT, NCS, CSRS Physical Therapist - [facility]  4:58 PM 01/24/24

## 2024-01-28 ENCOUNTER — Encounter: Admitting: Speech Pathology

## 2024-01-28 ENCOUNTER — Ambulatory Visit: Attending: Neurology | Admitting: Physical Therapy

## 2024-01-28 DIAGNOSIS — M25561 Pain in right knee: Secondary | ICD-10-CM | POA: Insufficient documentation

## 2024-01-28 DIAGNOSIS — R262 Difficulty in walking, not elsewhere classified: Secondary | ICD-10-CM | POA: Diagnosis present

## 2024-01-28 DIAGNOSIS — G8929 Other chronic pain: Secondary | ICD-10-CM | POA: Insufficient documentation

## 2024-01-28 DIAGNOSIS — M6281 Muscle weakness (generalized): Secondary | ICD-10-CM | POA: Diagnosis present

## 2024-01-28 DIAGNOSIS — R2689 Other abnormalities of gait and mobility: Secondary | ICD-10-CM | POA: Diagnosis present

## 2024-01-28 NOTE — Therapy (Signed)
 OUTPATIENT PHYSICAL THERAPY TREATMENT  Patient Name: Pamela Garner MRN: [id number] DOB:1941-03-12, 83 y.o., female Today's Date: 01/28/2024  PCP: Fernande Ophelia JINNY DOUGLAS, MD  REFERRING PROVIDER: Fernande Ophelia JINNY DOUGLAS, MD  END OF SESSION:   PT End of Session - 01/28/24 1535     Visit Number 16    Number of Visits 25    Date for PT Re-Evaluation 02/05/24    Authorization Type [id number] for 25 Pt visits from 11/13/2023 - 02/05/2024    Progress Note Due on Visit 20    PT Start Time 1530    PT Stop Time 1612    PT Time Calculation (min) 42 min    Equipment Utilized During Treatment Gait belt    Activity Tolerance Patient tolerated treatment well;No increased pain    Behavior During Therapy WFL for tasks assessed/performed              Past Medical History:  Diagnosis Date   Degenerative joint disease    Dermatophytosis, nail    feet   Hyperlipidemia    Hypertension    Lichen sclerosus    Thyroid disease    Trigeminal neuralgia of left side of face    Past Surgical History:  Procedure Laterality Date   ATRIAL FIBRILLATION ABLATION N/A 09/20/2023   Procedure: ATRIAL FIBRILLATION ABLATION;  Surgeon: Kennyth Chew, MD;  Location: [facility] INVASIVE CV LAB;  Service: Cardiovascular;  Laterality: N/A;   COLONOSCOPY  2008   DILATION AND CURETTAGE OF UTERUS  1985   endometrial polyps   Patient Active Problem List   Diagnosis Date Noted   New onset atrial flutter (HCC) 06/13/2023   Diplopia 06/12/2023   CVA (cerebral vascular accident) (HCC) 06/12/2023   Atrial fibrillation (HCC) 06/12/2023   GERD (gastroesophageal reflux disease) 10/14/2019   RBBB (right bundle branch block) 10/14/2019   Lichen sclerosus et atrophicus of the vulva 06/13/2017   Thyroid disease    Hypertension    Hyperlipidemia    Degenerative joint disease    ONSET DATE: 06/10/23  REFERRING DIAG: I63.9 (ICD-10-CM) - CVA (cerebral vascular accident) (HCC)  THERAPY DIAG:  No diagnosis  found.  Rationale for Evaluation and Treatment: Rehabilitation  SUBJECTIVE:                                                                                                                                                                                            SUBJECTIVE STATEMENT:  Patient reports she is feeling good today at this time does not want to do too much it will make her sore.  Patient reports she has had consistent soreness in her lateral  and posterior hips following therapy.  Physical therapist instructs her this is a normal response is likely indicative of weak muscles in her glutes as well as active strengthening of said muscles.  From Prior Sessions:  01/21/2024: Reports constant pain in R knee like an ache. Pt states she is unable to have surgery on R knee due to her heart. Pt states she has a big event for [employer] coming up in November (in [place] ) and will have to do a lot of walking at it. Pt states she plans to reach out to Orthopedic MD regarding getting a cortisone injection before this event because the gel injection she received last didn't provide her relief. Pt reports she uses her hurricane all the time for ambulation because she is unsteady.    PERTINENT HISTORY: 82yoF referred OPPT s/p CVA and falls. PMH: obesity, hypertension, hyperlipidemia, thyroid disease, CVA and new onset atrial fibrillation.   PAIN:  Are you having pain? Low back pain improving 0/10, Rt knee pain about the same 4/10  PRECAUTIONS: Fall  WEIGHT BEARING RESTRICTIONS: No  FALLS: Has patient fallen in last 6 months? Yes. Number of falls 3  LIVING ENVIRONMENT: Lives with: lives with their spouse Lives in: House/apartment Stairs: Yes: External: 3 steps; on right going up Has following equipment at home: Single point cane and Walker - 2 wheeled  PLOF: Independent  PATIENT GOALS: Pt is most concerned with the Rt knee and working it too much.   OBJECTIVE: Note:  Objective measures were completed at Evaluation unless otherwise noted.  DIAGNOSTIC FINDINGS:  EXAM: MRI HEAD WITHOUT CONTRAST  IMPRESSION: Moderate size acute infarct in the right occipital parietal cortex.   STAIRS: Findings: Level of Assistance: Modified independence, Stair Negotiation Technique: Step to Pattern Backwards Forwards With use of AD: cane but not utilized at all with Single Rail on Right, Number of Stairs: 4, Height of Stairs: 6   , and Comments: Pt descends stairs backwards due to reduced pain when performing this way.  FUNCTIONAL TESTS:  5 times sit to stand: 24.89 sec with UE push up from arm rests Timed up and go (TUG): 26.98 with no AD 6 minute walk test: TBD at next visit 10 meter walk test: 29.08 with no AD, 20.65 with cane Berg Balance Scale: TBD at next visit  PATIENT SURVEYS:  ABC scale 36.25% LEFS 25/80                                                                                                                              TREATMENT DATE: 01/28/24  Pt received in transport chair with her personal hurricane in tow.   Pt states she has kept the height of the cane the same since therapist adjusted it. TE- To improve strength, endurance, mobility, and function of specific targeted muscle groups or improve joint range of motion or improve muscle flexibility  B UE and B LE reciprocal movement pattern on Nustep  for B UE & LE functional strengthening and AROM for pain management at the following levels:  Level 1 resistance for 3 minutes Level 2 for 2 minutes  Seated hip abd hedgehog taps ( hip flexion plus abduction movement then return) 2 x 10 with 2.5# AW donned   TA- To improve functional movements patterns for everyday tasks   Standing heel raises with UE support 2 x 10 with 2.5# AW - pt reports low back pain following.   Gati with rollator x 150 ft- improved mechanics notes, pt reports less pain with gait and easier to control her balance.  Likes seat and basket functionality.   TE Seated hamstring curls 2 x 10 each lower extremity with green Thera-Band  Seated hip ABD with GTB x 10   Seated gluteal press down with black TB x 10 ea LE     PATIENT EDUCATION: Education details: Importance of HEP compliance in achieving best outcomes from PT.  Person educated: Patient Education method: Explanation Education comprehension: verbalized understanding  HOME EXERCISE PROGRAM: Access Code: [email or web address] URL: [email or web address] Date: 12/25/2023 Prepared by: Peggye Linear  Exercises - Seated Long Arc Quad  - 1 x daily - 7 x weekly - 2 sets - 10 reps - 1 sec hold - Seated March  - 1 x daily - 7 x weekly - 2 sets - 10 reps - Sit to Stand  - 1 x daily - 7 x weekly - 2 sets - 8 reps - Heel Raises with Counter Support  - 1 x daily - 7 x weekly - 2 sets - 15 reps  GOALS: Goals reviewed with patient? Yes  SHORT TERM GOALS: Target date: 12/11/2023  Pt will be independent with HEP in order to demonstrate increased ability to perform tasks related to occupation/hobbies. Baseline:  Issued 5/30, not performing anymore.  Goal status: NOT MET   LONG TERM GOALS: Target date: 02/05/2024  1.  Patient (> 41 years old) will complete five times sit to stand test in < 15 seconds indicating an increased LE strength and improved balance. Baseline: 24.89 sec 7/17: 24.52 seconds with UE support on arm rests Goal status: INITIAL  2.  Patient will improve the LEFS by 11% in order to demonstrate functional independence without pain. Baseline: 20/80; 25%  7/17: 18% Goal status: INITIAL   3.  Patient will increase Berg Balance score by > 6 points to demonstrate decreased fall risk during functional activities. Baseline: 42 7/17: 41 Goal status: INITIAL   3.  Patient will reduce timed up and go to <11 seconds to reduce fall risk and demonstrate improved transfer/gait ability. Baseline: 26.98 sec with no AD 7/17: 19.53 seconds  with no AD Goal status: INITIAL  4.  Patient will increase 10 meter walk test to >1.61m/s as to improve gait speed for better community ambulation and to reduce fall risk. Baseline: 29.08 with no AD; 20.65 with cane 7/17: assess next session Goal status: INITIAL  5.  Patient will increase six minute walk test distance to >1000 for progression to community ambulator and improve gait ability Baseline: 465 ft with [facility]  7/17: 483 ft with [facility] Goal status: INITIAL   ASSESSMENT:  CLINICAL IMPRESSION:  Patient presents with good motivation for physical therapy activities but does request therapist attend exercises that will not cause excessive soreness in her lateral and posterior gluteal muscles.  Patient does utilize rollator for ambulation during today's session and shows improved mechanics of gait as well as improved speed and safety.  Physical therapist instructed patient that she also able to sit on it and patient enjoyed the functionality of the rollator.  Will continue to trial rollator in future sessions to see if patient is interested in using this as a safe or more functional means of ambulation for longer distances compared to her current 2 wheeled walker or her straight point cane which she admits she cannot utilize for long distances at this time. Pt will continue to benefit from skilled physical therapy intervention to address impairments, improve QOL, and attain therapy goals.    OBJECTIVE IMPAIRMENTS: Abnormal gait, decreased activity tolerance, decreased balance, decreased endurance, decreased mobility, difficulty walking, decreased strength, and pain.  ACTIVITY LIMITATIONS: carrying, lifting, bending, sitting, standing, squatting, stairs, transfers, and locomotion level PARTICIPATION LIMITATIONS: meal prep, cleaning, laundry, driving, shopping, community activity, and yard work PERSONAL FACTORS: Age, Fitness, Past/current experiences, Time since onset of injury/illness/exacerbation,  and 3+ comorbidities: HTN, CVA, Afib, DJD, R knee pain are also affecting patient's functional outcome.  REHAB POTENTIAL: Good CLINICAL DECISION MAKING: Evolving/moderate complexity EVALUATION COMPLEXITY: Moderate  PLAN:  PT FREQUENCY: 2x/week  PT DURATION: 12 weeks  PLANNED INTERVENTIONS: 97750- Physical Performance Testing, 97110-Therapeutic exercises, 97530- Therapeutic activity, 97112- Neuromuscular re-education, 97535- Self Care, [id number]- Manual therapy, [phone]- Gait training, Balance training, Stair training, Dry Needling, Joint mobilization, Joint manipulation, Spinal manipulation, Spinal mobilization, Vestibular training, Cryotherapy, and Moist heat  PLAN FOR NEXT SESSION:  follow-up on corrected gait pattern with use of hurricane in L UE and 2-point pattern Dynamic balance progression as tolerated with pain BLE strengthening for pain management  - specifically hip extensors, hip abductors, and quads  Progression of LE strengthening and endurance based activities    - progress gait endurance to prepare patient for ambulating  Consider supine or mat level strength if pt tolerates longer distances at her event in November     Note: Portions of this document were prepared using Conservation officer, historic buildings and although reviewed may contain unintentional dictation errors in syntax, grammar, or spelling.  Lonni KATHEE Gainer PT ,DPT Physical Therapist- [place]  [facility]   3:36 PM 01/28/24

## 2024-01-31 ENCOUNTER — Encounter: Admitting: Speech Pathology

## 2024-01-31 ENCOUNTER — Ambulatory Visit: Admitting: Physical Therapy

## 2024-01-31 DIAGNOSIS — R2689 Other abnormalities of gait and mobility: Secondary | ICD-10-CM

## 2024-01-31 DIAGNOSIS — G8929 Other chronic pain: Secondary | ICD-10-CM

## 2024-01-31 DIAGNOSIS — R262 Difficulty in walking, not elsewhere classified: Secondary | ICD-10-CM | POA: Diagnosis not present

## 2024-01-31 DIAGNOSIS — M6281 Muscle weakness (generalized): Secondary | ICD-10-CM

## 2024-01-31 NOTE — Therapy (Signed)
 OUTPATIENT PHYSICAL THERAPY TREATMENT  Patient Name: Pamela Garner MRN: [id number] DOB:10/18/1940, 83 y.o., female Today's Date: 01/31/2024  PCP: Fernande Ophelia JINNY DOUGLAS, MD  REFERRING PROVIDER: Fernande Ophelia JINNY DOUGLAS, MD  END OF SESSION:   PT End of Session - 01/31/24 1407     Visit Number 17    Number of Visits 25    Date for PT Re-Evaluation 02/05/24    Authorization Type UHC [id number] 25 Pt visits from 11/13/2023 - 02/05/2024    Progress Note Due on Visit 20    PT Start Time 1402    PT Stop Time 1441    PT Time Calculation (min) 39 min    Equipment Utilized During Treatment Gait belt    Activity Tolerance Patient tolerated treatment well;No increased pain    Behavior During Therapy WFL for tasks assessed/performed              Past Medical History:  Diagnosis Date   Degenerative joint disease    Dermatophytosis, nail    feet   Hyperlipidemia    Hypertension    Lichen sclerosus    Thyroid disease    Trigeminal neuralgia of left side of face    Past Surgical History:  Procedure Laterality Date   ATRIAL FIBRILLATION ABLATION N/A 09/20/2023   Procedure: ATRIAL FIBRILLATION ABLATION;  Surgeon: Kennyth Chew, MD;  Location: [facility] INVASIVE CV LAB;  Service: Cardiovascular;  Laterality: N/A;   COLONOSCOPY  2008   DILATION AND CURETTAGE OF UTERUS  1985   endometrial polyps   Patient Active Problem List   Diagnosis Date Noted   New onset atrial flutter (HCC) 06/13/2023   Diplopia 06/12/2023   CVA (cerebral vascular accident) (HCC) 06/12/2023   Atrial fibrillation (HCC) 06/12/2023   GERD (gastroesophageal reflux disease) 10/14/2019   RBBB (right bundle branch block) 10/14/2019   Lichen sclerosus et atrophicus of the vulva 06/13/2017   Thyroid disease    Hypertension    Hyperlipidemia    Degenerative joint disease    ONSET DATE: 06/10/23  REFERRING DIAG: I63.9 (ICD-10-CM) - CVA (cerebral vascular accident) (HCC)  THERAPY DIAG:  Difficulty in walking, not  elsewhere classified  Imbalance  Muscle weakness (generalized)  Chronic pain of right knee  Rationale for Evaluation and Treatment: Rehabilitation  SUBJECTIVE:                                                                                                                                                                                            SUBJECTIVE STATEMENT:  Patient reports she is feeling good today at this time does not want to do too much it  will make her sore.  Patient reports she has had consistent soreness in her lateral and posterior hips following therapy.  Physical therapist instructs her this is a normal response is likely indicative of weak muscles in her glutes as well as active strengthening of said muscles.  From Prior Sessions:  01/21/2024: Reports constant pain in R knee like an ache. Pt states she is unable to have surgery on R knee due to her heart. Pt states she has a big event for [employer] coming up in November (in [place] ) and will have to do a lot of walking at it. Pt states she plans to reach out to Orthopedic MD regarding getting a cortisone injection before this event because the gel injection she received last didn't provide her relief. Pt reports she uses her hurricane all the time for ambulation because she is unsteady.    PERTINENT HISTORY: 82yoF referred OPPT s/p CVA and falls. PMH: obesity, hypertension, hyperlipidemia, thyroid disease, CVA and new onset atrial fibrillation.   PAIN:  Are you having pain? Low back pain improving 0/10, Rt knee pain about the same 4/10  PRECAUTIONS: Fall  WEIGHT BEARING RESTRICTIONS: No  FALLS: Has patient fallen in last 6 months? Yes. Number of falls 3  LIVING ENVIRONMENT: Lives with: lives with their spouse Lives in: House/apartment Stairs: Yes: External: 3 steps; on right going up Has following equipment at home: Single point cane and Walker - 2 wheeled  PLOF: Independent  PATIENT GOALS:  Pt is most concerned with the Rt knee and working it too much.   OBJECTIVE: Note: Objective measures were completed at Evaluation unless otherwise noted.  DIAGNOSTIC FINDINGS:  EXAM: MRI HEAD WITHOUT CONTRAST  IMPRESSION: Moderate size acute infarct in the right occipital parietal cortex.   STAIRS: Findings: Level of Assistance: Modified independence, Stair Negotiation Technique: Step to Pattern Backwards Forwards With use of AD: cane but not utilized at all with Single Rail on Right, Number of Stairs: 4, Height of Stairs: 6   , and Comments: Pt descends stairs backwards due to reduced pain when performing this way.  FUNCTIONAL TESTS:  5 times sit to stand: 24.89 sec with UE push up from arm rests Timed up and go (TUG): 26.98 with no AD 6 minute walk test: TBD at next visit 10 meter walk test: 29.08 with no AD, 20.65 with cane Berg Balance Scale: TBD at next visit  PATIENT SURVEYS:  ABC scale 36.25% LEFS 25/80                                                                                                                              TREATMENT DATE: 01/31/24  Pt received in transport chair with her personal hurricane in tow.   Pt states she has kept the height of the cane the same since therapist adjusted it. TE- To improve strength, endurance, mobility, and function of specific targeted muscle groups or improve joint range of motion  or improve muscle flexibility  B UE and B LE reciprocal movement pattern on Nustep for B UE & LE functional strengthening and AROM for pain management at the following levels:  3 x 6 min   TA- To improve functional movements patterns for everyday tasks   Gati with rollator x 150 ft- improved mechanics noted, pt reports less pain with gait and easier to control her balance. Likes seat and basket functionality.   TE- To improve strength, endurance, mobility, and function of specific targeted muscle groups or improve joint range of motion or  improve muscle flexibility  Seated hip abd hedgehog taps ( hip flexion plus abduction movement then return) 2 x 10 with 2.5# AW donned   TA- To improve functional movements patterns for everyday tasks   Gati with rollator x 150 ft- improved mechanics noted, pt reports less pain with gait and easier to control her balance. Likes seat and basket functionality.   Standing heel raises with UE support 3 x 12 - pt reports low back pain following.   Gait with rollator x 225 ft- improved mechanics noted, pt reports less pain with gait and easier to control her balance. Likes seat and basket functionality.   TE   Seated hip ABD with GTB x 20    PATIENT EDUCATION: Education details: Importance of HEP compliance in achieving best outcomes from PT.  Person educated: Patient Education method: Explanation Education comprehension: verbalized understanding  HOME EXERCISE PROGRAM: Access Code: [email or web address] [phone] URL: [email or web address] Date: 12/25/2023 Prepared by: Peggye Linear  Exercises - Seated Long Arc Quad  - 1 x daily - 7 x weekly - 2 sets - 10 reps - 1 sec hold - Seated March  - 1 x daily - 7 x weekly - 2 sets - 10 reps - Sit to Stand  - 1 x daily - 7 x weekly - 2 sets - 8 reps - Heel Raises with Counter Support  - 1 x daily - 7 x weekly - 2 sets - 15 reps  GOALS: Goals reviewed with patient? Yes  SHORT TERM GOALS: Target date: 12/11/2023  Pt will be independent with HEP in order to demonstrate increased ability to perform tasks related to occupation/hobbies. Baseline:  Issued 5/30, not performing anymore.  Goal status: NOT MET   LONG TERM GOALS: Target date: 02/05/2024  1.  Patient (> 70 years old) will complete five times sit to stand test in < 15 seconds indicating an increased LE strength and improved balance. Baseline: 24.89 sec 7/17: 24.52 seconds with UE support on arm rests Goal status: INITIAL  2.  Patient will improve the LEFS by 11% in order to demonstrate  functional independence without pain. Baseline: 20/80; 25%  7/17: 18% Goal status: INITIAL   3.  Patient will increase Berg Balance score by > 6 points to demonstrate decreased fall risk during functional activities. Baseline: 42 7/17: 41 Goal status: INITIAL   3.  Patient will reduce timed up and go to <11 seconds to reduce fall risk and demonstrate improved transfer/gait ability. Baseline: 26.98 sec with no AD 7/17: 19.53 seconds with no AD Goal status: INITIAL  4.  Patient will increase 10 meter walk test to >1.13m/s as to improve gait speed for better community ambulation and to reduce fall risk. Baseline: 29.08 with no AD; 20.65 with cane 7/17: assess next session Goal status: INITIAL  5.  Patient will increase six minute walk test distance to >1000 for progression to community ambulator and improve gait ability  Baseline: 465 ft with [facility]  7/17: 483 ft with [facility] Goal status: INITIAL   ASSESSMENT:  CLINICAL IMPRESSION:  Patient presents with good motivation for physical therapy activities. Pt showed continued improvement in gait quality with 4WW this date and volume of gait was able to be increased as a result. Pt enjoyed 4WW and is interested in looking for one outside of therapy for home and community use.  Pt will continue to benefit from skilled physical therapy intervention to address impairments, improve QOL, and attain therapy goals.    OBJECTIVE IMPAIRMENTS: Abnormal gait, decreased activity tolerance, decreased balance, decreased endurance, decreased mobility, difficulty walking, decreased strength, and pain.  ACTIVITY LIMITATIONS: carrying, lifting, bending, sitting, standing, squatting, stairs, transfers, and locomotion level PARTICIPATION LIMITATIONS: meal prep, cleaning, laundry, driving, shopping, community activity, and yard work PERSONAL FACTORS: Age, Fitness, Past/current experiences, Time since onset of injury/illness/exacerbation, and 3+ comorbidities: HTN, CVA,  Afib, DJD, R knee pain are also affecting patient's functional outcome.  REHAB POTENTIAL: Good CLINICAL DECISION MAKING: Evolving/moderate complexity EVALUATION COMPLEXITY: Moderate  PLAN:  PT FREQUENCY: 2x/week  PT DURATION: 12 weeks  PLANNED INTERVENTIONS: 97750- Physical Performance Testing, 97110-Therapeutic exercises, 97530- Therapeutic activity, 97112- Neuromuscular re-education, 97535- Self Care, [id number]- Manual therapy, [phone]- Gait training, Balance training, Stair training, Dry Needling, Joint mobilization, Joint manipulation, Spinal manipulation, Spinal mobilization, Vestibular training, Cryotherapy, and Moist heat  PLAN FOR NEXT SESSION:  Dynamic balance progression as tolerated with pain BLE strengthening for pain management  - specifically hip extensors, hip abductors, and quads  Progression of LE strengthening and endurance based activities    - progress gait endurance to prepare patient for ambulating  Consider supine or mat level strength if pt tolerates longer distances at her event in November     Note: Portions of this document were prepared using Conservation officer, historic buildings and although reviewed may contain unintentional dictation errors in syntax, grammar, or spelling.  Lonni KATHEE Gainer PT ,DPT Physical Therapist- [facility]   2:08 PM 01/31/24

## 2024-02-04 ENCOUNTER — Ambulatory Visit: Admitting: Physical Therapy

## 2024-02-04 ENCOUNTER — Encounter: Admitting: Speech Pathology

## 2024-02-04 DIAGNOSIS — M6281 Muscle weakness (generalized): Secondary | ICD-10-CM

## 2024-02-04 DIAGNOSIS — R2689 Other abnormalities of gait and mobility: Secondary | ICD-10-CM

## 2024-02-04 DIAGNOSIS — G8929 Other chronic pain: Secondary | ICD-10-CM

## 2024-02-04 DIAGNOSIS — R262 Difficulty in walking, not elsewhere classified: Secondary | ICD-10-CM

## 2024-02-04 NOTE — Therapy (Signed)
 OUTPATIENT PHYSICAL THERAPY TREATMENT  Patient Name: Pamela Garner MRN: [id number] DOB:12/22/1940, 83 y.o., female Today's Date: 02/04/2024  PCP: Fernande Ophelia JINNY DOUGLAS, MD  REFERRING PROVIDER: Fernande Ophelia JINNY DOUGLAS, MD  END OF SESSION:   PT End of Session - 02/04/24 1314     Visit Number 18    Number of Visits 25    Date for PT Re-Evaluation 02/05/24    Authorization Type UHC [id number] for 25 Pt visits from 11/13/2023 - 02/05/2024    Progress Note Due on Visit 20    PT Start Time 1309    PT Stop Time 1349    PT Time Calculation (min) 40 min    Equipment Utilized During Treatment Gait belt    Activity Tolerance Patient tolerated treatment well;No increased pain    Behavior During Therapy WFL for tasks assessed/performed               Past Medical History:  Diagnosis Date   Degenerative joint disease    Dermatophytosis, nail    feet   Hyperlipidemia    Hypertension    Lichen sclerosus    Thyroid disease    Trigeminal neuralgia of left side of face    Past Surgical History:  Procedure Laterality Date   ATRIAL FIBRILLATION ABLATION N/A 09/20/2023   Procedure: ATRIAL FIBRILLATION ABLATION;  Surgeon: Kennyth Chew, MD;  Location: [facility] INVASIVE CV LAB;  Service: Cardiovascular;  Laterality: N/A;   COLONOSCOPY  2008   DILATION AND CURETTAGE OF UTERUS  1985   endometrial polyps   Patient Active Problem List   Diagnosis Date Noted   New onset atrial flutter (HCC) 06/13/2023   Diplopia 06/12/2023   CVA (cerebral vascular accident) (HCC) 06/12/2023   Atrial fibrillation (HCC) 06/12/2023   GERD (gastroesophageal reflux disease) 10/14/2019   RBBB (right bundle branch block) 10/14/2019   Lichen sclerosus et atrophicus of the vulva 06/13/2017   Thyroid disease    Hypertension    Hyperlipidemia    Degenerative joint disease    ONSET DATE: 06/10/23  REFERRING DIAG: I63.9 (ICD-10-CM) - CVA (cerebral vascular accident) (HCC)  THERAPY DIAG:  Difficulty in walking, not  elsewhere classified  Imbalance  Muscle weakness (generalized)  Chronic pain of right knee  Rationale for Evaluation and Treatment: Rehabilitation  SUBJECTIVE:                                                                                                                                                                                            SUBJECTIVE STATEMENT:  Pt reports feeling good after last session. No high amount of soreness that lingers as much.  From Prior Sessions:  01/21/2024: Reports constant pain in R knee like an ache. Pt states she is unable to have surgery on R knee due to her heart. Pt states she has a big event for [employer] coming up in November (in [place] ) and will have to do a lot of walking at it. Pt states she plans to reach out to Orthopedic MD regarding getting a cortisone injection before this event because the gel injection she received last didn't provide her relief. Pt reports she uses her hurricane all the time for ambulation because she is unsteady.    PERTINENT HISTORY: 82yoF referred OPPT s/p CVA and falls. PMH: obesity, hypertension, hyperlipidemia, thyroid disease, CVA and new onset atrial fibrillation.   PAIN:  Are you having pain? Low back pain improving 0/10, Rt knee pain about the same 4/10  PRECAUTIONS: Fall  WEIGHT BEARING RESTRICTIONS: No  FALLS: Has patient fallen in last 6 months? Yes. Number of falls 3  LIVING ENVIRONMENT: Lives with: lives with their spouse Lives in: House/apartment Stairs: Yes: External: 3 steps; on right going up Has following equipment at home: Single point cane and Walker - 2 wheeled  PLOF: Independent  PATIENT GOALS: Pt is most concerned with the Rt knee and working it too much.   OBJECTIVE: Note: Objective measures were completed at Evaluation unless otherwise noted.  DIAGNOSTIC FINDINGS:  EXAM: MRI Garner WITHOUT CONTRAST  IMPRESSION: Moderate size acute infarct in the right  occipital parietal cortex.   STAIRS: Findings: Level of Assistance: Modified independence, Stair Negotiation Technique: Step to Pattern Backwards Forwards With use of AD: cane but not utilized at all with Single Rail on Right, Number of Stairs: 4, Height of Stairs: 6   , and Comments: Pt descends stairs backwards due to reduced pain when performing this way.  FUNCTIONAL TESTS:  5 times sit to stand: 24.89 sec with UE push up from arm rests Timed up and go (TUG): 26.98 with no AD 6 minute walk test: TBD at next visit 10 meter walk test: 29.08 with no AD, 20.65 with cane Berg Balance Scale: TBD at next visit  PATIENT SURVEYS:  ABC scale 36.25% LEFS 25/80                                                                                                                              TREATMENT DATE: 02/04/24  Pt received in transport chair with her personal hurricane in tow.   Pt states she has kept the height of the cane the same since therapist adjusted it. TE- To improve strength, endurance, mobility, and function of specific targeted muscle groups or improve joint range of motion or improve muscle flexibility  B UE and B LE reciprocal movement pattern on Nustep for B UE & LE functional strengthening and AROM for pain management at the following levels:  3 x 6 min   Physical Performance Test or Measurement: a  physical performance test(s)  or measurement (eg,  musculoskeletal, functional capacity), with written report,  each 15 mins   6 Min Walk Test:  Instructed patient to ambulate as quickly and as safely as possible for 6 minutes using LRAD. Patient was allowed to take standing rest breaks without stopping the test, but if the patient required a sitting rest break the clock would be stopped and the test would be over.  Results: 749 feet using a 4WW. Results indicate that the patient has reduced endurance with ambulation compared to age matched norms.  Age Matched Norms (in  meters): 2-69 yo M: 49 F: 13, 74-79 yo M: 74 F: 471, 77-89 yo M: 417 F: 392 MDC: 58.21 meters (190.98 feet) or 50 meters (ANPTA Core Set of Outcome Measures for Adults with Neurologic Conditions, 2018)  10 Meter Walk Test: Patient instructed to walk 10 meters (32.8 ft) as quickly and as safely as possible at their normal speed Results: .617 m/s, .657 m/s on respective first and second trials  Cut off scores:   Household Ambulator  < 0.4 m/s  Limited Community Ambulator  0.4 - 0.8 m/s  [employer]  > 0.8 m/s  Increased fall risk  < 1.47m/s  Crossing a Street  >1.36m/s  MCID 0.05 m/s (small), 0.13 m/s (moderate), 0.06 m/s (significant)  (ANPTA Core Set of Outcome Measures for Adults with Neurologic Conditions, 2018)   Five times Sit to Stand Test (FTSS)  TIME: 24.06  sec  Cut off scores indicative of increased fall risk: >12 sec CVA, >16 sec PD, >13 sec vestibular (ANPTA Core Set of Outcome Measures for Adults with Neurologic Conditions, 2018)  Self care Informed patient of ways she can continue to accelerate her progress particularly with her general lower extremity strength.   TE- To improve strength, endurance, mobility, and function of specific targeted muscle groups or improve joint range of motion or improve muscle flexibility  Seated hip abd hedgehog taps ( hip flexion plus abduction movement then return) 2 x 10 with 2.5# AW donned    TA- To improve functional movements patterns for everyday tasks   STS x 5 reps with cues for forward trunk lean   Shopping cart style gait from clinic to elevator to parking area to end session. ( 7 min)    PATIENT EDUCATION: Education details: Importance of HEP compliance in achieving best outcomes from PT.  Person educated: Patient Education method: Explanation Education comprehension: verbalized understanding  HOME EXERCISE PROGRAM: Access Code: [phone] URL: [email or web address] Date:  12/25/2023 Prepared by: Peggye Linear  Exercises - Seated Long Arc Quad  - 1 x daily - 7 x weekly - 2 sets - 10 reps - 1 sec hold - Seated March  - 1 x daily - 7 x weekly - 2 sets - 10 reps - Sit to Stand  - 1 x daily - 7 x weekly - 2 sets - 8 reps - Heel Raises with Counter Support  - 1 x daily - 7 x weekly - 2 sets - 15 reps  GOALS: Goals reviewed with patient? Yes  SHORT TERM GOALS: Target date: 12/11/2023  Pt will be independent with HEP in order to demonstrate increased ability to perform tasks related to occupation/hobbies. Baseline:  Issued 5/30, not performing anymore.  Goal status: NOT MET   LONG TERM GOALS: Target date: 02/05/2024  1.  Patient (> 29 years old) will complete five times sit to stand test in < 15 seconds indicating an increased LE strength and improved balance.  Baseline: 24.89 sec 7/17: 24.52 seconds with UE support on arm rests 8/11:24 sec with B UE assist Goal status: INITIAL  2.  Patient will improve the LEFS by 11% in order to demonstrate functional independence without pain. Baseline: 20/80; 25%  7/17: 18% Goal status: INITIAL   3.  Patient will increase Berg Balance score by > 6 points to demonstrate decreased fall risk during functional activities. Baseline: 42 7/17: 41 Goal status: INITIAL   3.  Patient will reduce timed up and go to <11 seconds to reduce fall risk and demonstrate improved transfer/gait ability. Baseline: 26.98 sec with no AD 7/17: 19.53 seconds with no AD Goal status: INITIAL  4.  Patient will increase 10 meter walk test to >1.34m/s as to improve gait speed for better community ambulation and to reduce fall risk. Baseline: 29.08 with no AD; 20.65 with cane 8/11:16.2, 15.2 sec on first and second trial respectively using 4WW Goal status: INITIAL  5.  Patient will increase six minute walk test distance to >1000 for progression to community ambulator and improve gait ability Baseline: 465 ft with [facility]  7/17: 483 ft with  [facility] 8/11:749 ft with 4WW Goal status: ONGOING   ASSESSMENT:  CLINICAL IMPRESSION:   Patient presents with good motivation for completion of physical therapy activities.  Patient tested with several long-term goals including 6-minute walk test, 5 times sit to stand, and 10 m walk test due to patient's authorization.  Needing to be resubmitted.  Patient has made good progress with her gait speed and endurance and shows particular progress with utilizing a rollator in comparison to cane or unassisted gait.  Patient encouraged to request prescription for rollator from Dr. She can utilize this style of gait more at home and into the community.  Patient encouraged to continue with efforts at home to improve her lower extremity strength and is particularly challenged with sit to stand as well as lateral movements to improve her hip abduction strength. Pt will continue to benefit from skilled physical therapy intervention to address impairments, improve QOL, and attain therapy goals.     OBJECTIVE IMPAIRMENTS: Abnormal gait, decreased activity tolerance, decreased balance, decreased endurance, decreased mobility, difficulty walking, decreased strength, and pain.  ACTIVITY LIMITATIONS: carrying, lifting, bending, sitting, standing, squatting, stairs, transfers, and locomotion level PARTICIPATION LIMITATIONS: meal prep, cleaning, laundry, driving, shopping, community activity, and yard work PERSONAL FACTORS: Age, Fitness, Past/current experiences, Time since onset of injury/illness/exacerbation, and 3+ comorbidities: HTN, CVA, Afib, DJD, R knee pain are also affecting patient's functional outcome.  REHAB POTENTIAL: Good CLINICAL DECISION MAKING: Evolving/moderate complexity EVALUATION COMPLEXITY: Moderate  PLAN:  PT FREQUENCY: 2x/week  PT DURATION: 12 weeks  PLANNED INTERVENTIONS: 97750- Physical Performance Testing, 97110-Therapeutic exercises, 97530- Therapeutic activity, 97112- Neuromuscular  re-education, 97535- Self Care, [id number]- Manual therapy, [phone]- Gait training, Balance training, Stair training, Dry Needling, Joint mobilization, Joint manipulation, Spinal manipulation, Spinal mobilization, Vestibular training, Cryotherapy, and Moist heat  PLAN FOR NEXT SESSION:  Dynamic balance progression as tolerated with pain BLE strengthening for pain management  - specifically hip extensors, hip abductors, and quads  Progression of LE strengthening and endurance based activities    - progress gait endurance to prepare patient for ambulating  Consider supine or mat level strength if pt tolerates longer distances at her event in November     Note: Portions of this document were prepared using Conservation officer, historic buildings and although reviewed may contain unintentional dictation errors in syntax, grammar, or spelling.  Lonni KATHEE Gainer  PT ,DPT Physical Therapist- [place]  [facility]   1:15 PM 02/04/24

## 2024-02-07 ENCOUNTER — Ambulatory Visit

## 2024-02-07 ENCOUNTER — Encounter: Admitting: Speech Pathology

## 2024-02-07 DIAGNOSIS — G8929 Other chronic pain: Secondary | ICD-10-CM

## 2024-02-07 DIAGNOSIS — R2689 Other abnormalities of gait and mobility: Secondary | ICD-10-CM

## 2024-02-07 DIAGNOSIS — R262 Difficulty in walking, not elsewhere classified: Secondary | ICD-10-CM

## 2024-02-07 DIAGNOSIS — M6281 Muscle weakness (generalized): Secondary | ICD-10-CM

## 2024-02-07 NOTE — Therapy (Signed)
 OUTPATIENT PHYSICAL THERAPY TREATMENT  Patient Name: Pamela Garner MRN: [id number] DOB:[birth year], 83 y.o., female Today's Date: 02/07/2024  PCP: Fernande Ophelia JINNY DOUGLAS, MD  REFERRING PROVIDER: Fernande Ophelia JINNY DOUGLAS, MD  END OF SESSION:   PT End of Session - 02/07/24 1547     Visit Number 19    Number of Visits 25    Date for PT Re-Evaluation 02/05/24    Authorization Type [employer] Medicare    Authorization Time Period 02/04/24-03/03/24 for 6 visits    Authorization - Visit Number 2    Authorization - Number of Visits 6    Progress Note Due on Visit 20    PT Start Time 1532    PT Stop Time 1612    PT Time Calculation (min) 40 min    Equipment Utilized During Treatment Gait belt    Activity Tolerance Patient tolerated treatment well;No increased pain    Behavior During Therapy WFL for tasks assessed/performed          Past Medical History:  Diagnosis Date   Degenerative joint disease    Dermatophytosis, nail    feet   Hyperlipidemia    Hypertension    Lichen sclerosus    Thyroid disease    Trigeminal neuralgia of left side of face    Past Surgical History:  Procedure Laterality Date   ATRIAL FIBRILLATION ABLATION N/A 09/20/2023   Procedure: ATRIAL FIBRILLATION ABLATION;  Surgeon: Kennyth Chew, MD;  Location: [facility] INVASIVE CV LAB;  Service: Cardiovascular;  Laterality: N/A;   COLONOSCOPY  2008   DILATION AND CURETTAGE OF UTERUS  1985   endometrial polyps   Patient Active Problem List   Diagnosis Date Noted   New onset atrial flutter (HCC) 06/13/2023   Diplopia 06/12/2023   CVA (cerebral vascular accident) (HCC) 06/12/2023   Atrial fibrillation (HCC) 06/12/2023   GERD (gastroesophageal reflux disease) 10/14/2019   RBBB (right bundle branch block) 10/14/2019   Lichen sclerosus et atrophicus of the vulva 06/13/2017   Thyroid disease    Hypertension    Hyperlipidemia    Degenerative joint disease    ONSET DATE: 06/10/23  REFERRING DIAG: I63.9 (ICD-10-CM) - CVA (cerebral  vascular accident) (HCC)  THERAPY DIAG:  Difficulty in walking, not elsewhere classified  Imbalance  Muscle weakness (generalized)  Chronic pain of right knee  Rationale for Evaluation and Treatment: Rehabilitation  SUBJECTIVE:                                                                                                                                                                                            SUBJECTIVE STATEMENT: Pt sore in back  this morning, may have been related to road trip to [place] yesterday.   PERTINENT HISTORY: 82yoF referred OPPT s/p CVA and falls. PMH: obesity, hypertension, hyperlipidemia, thyroid disease, CVA and new onset atrial fibrillation.   PAIN:  Are you having pain? No  PRECAUTIONS: Fall  WEIGHT BEARING RESTRICTIONS: No  FALLS: Has patient fallen in last 6 months? Yes. Number of falls 3  LIVING ENVIRONMENT: Lives with: lives with their spouse Lives in: House/apartment Stairs: Yes: External: 3 steps; on right going up Has following equipment at home: Single point cane and Walker - 2 wheeled  PLOF: Independent  PATIENT GOALS: Pt is most concerned with the Rt knee and working it too much.   OBJECTIVE: Note: Objective measures were completed at Evaluation unless otherwise noted.                                                                                                                            TREATMENT DATE: 02/07/24 -B UE and B LE reciprocal movement pattern on Nustep for B UE & LE functional strengthening and AROM for pain management at the following levels: 3 x 6 min  -Heel raise x12 -lateral step tap c 2.5 AW bilat 1x10 -Heel raise x12 -lateral step tap c 2.5 AW bilat 1x10 -Heel raise x12 -lateral step tap c 2.5 AW bilat 1x10 -seated marching 2.5lb AW 1x20 -STS from chair 2 hand push off 1x10 -seated marching 2.5lb AW 1x20 alteranting over SPC on floor  -STS from chair 2 hand push off 1x12  PATIENT  EDUCATION: Education details: how to navigate post PT soreness and symptoms reagrding HEP performance.  Person educated: Patient Education method: Explanation Education comprehension: verbalized understanding  HOME EXERCISE PROGRAM: Access Code: [email or web address] URL: [email or web address] Date: 12/25/2023 Prepared by: Peggye Linear  Exercises - Seated Long Arc Quad  - 1 x daily - 7 x weekly - 2 sets - 10 reps - 1 sec hold - Seated March  - 1 x daily - 7 x weekly - 2 sets - 10 reps - Sit to Stand  - 1 x daily - 7 x weekly - 2 sets - 8 reps - Heel Raises with Counter Support  - 1 x daily - 7 x weekly - 2 sets - 15 reps  GOALS: Goals reviewed with patient? Yes  SHORT TERM GOALS: Target date: 12/11/2023  Pt will be independent with HEP in order to demonstrate increased ability to perform tasks related to occupation/hobbies. Baseline:  Issued 5/30, not performing anymore.  Goal status: NOT MET   LONG TERM GOALS: Target date: 03/03/2024  1.  Patient (> 49 years old) will complete five times sit to stand test in < 15 seconds indicating an increased LE strength and improved balance. Baseline: 24.89 sec; 7/17: 24.52 seconds with UE support on arm rests 8/11:24 sec with B UE assist Goal status: UNCHANGED  2.  Patient will improve the LEFS by 11% in order to demonstrate functional independence without  pain. Baseline: 20/80; 25%; 7/17: 18% Goal status: UNCHANGED    3.  Patient will increase Berg Balance score by > 6 points to demonstrate decreased fall risk during functional activities. Baseline: 42; 7/17: 41 Goal status: UNCHANGED    3.  Patient will reduce timed up and go to <11 seconds to reduce fall risk and demonstrate improved transfer/gait ability. Baseline: 26.98 sec with no AD; 7/17: 19.53 seconds with no AD Goal status: IN PROGRESS  4.  Patient will increase 10 meter walk test to >1.27m/s as to improve gait speed for better community ambulation and to reduce fall  risk. Baseline: 29.08 with no AD; 20.65 with cane; 8/11:16.2, 15.2 sec on first and second trial respectively using 4WW (0.91m/s)  Goal status: IN PROGRESS  5.  Patient will increase six minute walk test distance to >1000 for progression to community ambulator and improve gait ability Baseline: 465 ft with SPC; 7/17: 483 ft with SPC; 8/11:749 ft with 4WW Goal status: IN PROGRESS  ASSESSMENT:  CLINICAL IMPRESSION:  Continued with tried and true interventions. Pt gives feed back on on fatigue need for breaks as is typical, no major deviations from typical performance. Pt's most recent cert is now approved for 6 visits ending in first week of September, so should start preparing for DC to independent program. Pt remains in good spirits Pt will continue to benefit from skilled physical therapy intervention to address impairments, improve QOL, and attain therapy goals.    OBJECTIVE IMPAIRMENTS: Abnormal gait, decreased activity tolerance, decreased balance, decreased endurance, decreased mobility, difficulty walking, decreased strength, and pain.  ACTIVITY LIMITATIONS: carrying, lifting, bending, sitting, standing, squatting, stairs, transfers, and locomotion level PARTICIPATION LIMITATIONS: meal prep, cleaning, laundry, driving, shopping, community activity, and yard work PERSONAL FACTORS: Age, Fitness, Past/current experiences, Time since onset of injury/illness/exacerbation, and 3+ comorbidities: HTN, CVA, Afib, DJD, R knee pain are also affecting patient's functional outcome.  REHAB POTENTIAL: Good CLINICAL DECISION MAKING: Evolving/moderate complexity EVALUATION COMPLEXITY: Moderate  PLAN:  PT FREQUENCY: 2x/week  PT DURATION: 12 weeks  PLANNED INTERVENTIONS: 97750- Physical Performance Testing, 97110-Therapeutic exercises, 97530- Therapeutic activity, 97112- Neuromuscular re-education, 97535- Self Care, [id number]- Manual therapy, [phone]- Gait training, Balance training, Stair training, Dry  Needling, Joint mobilization, Joint manipulation, Spinal manipulation, Spinal mobilization, Vestibular training, Cryotherapy, and Moist heat  PLAN FOR NEXT SESSION:  Pt has 4 remaining authorized visits; start preparing for DC, HEP, rollator order, etc.   4:30 PM, 02/07/24 Peggye JAYSON Linear, PT, DPT Physical Therapist - [facility]  [phone] [facility])

## 2024-02-11 ENCOUNTER — Encounter: Admitting: Speech Pathology

## 2024-02-11 ENCOUNTER — Ambulatory Visit

## 2024-02-11 DIAGNOSIS — R262 Difficulty in walking, not elsewhere classified: Secondary | ICD-10-CM

## 2024-02-11 DIAGNOSIS — M6281 Muscle weakness (generalized): Secondary | ICD-10-CM

## 2024-02-11 DIAGNOSIS — G8929 Other chronic pain: Secondary | ICD-10-CM

## 2024-02-11 DIAGNOSIS — R2689 Other abnormalities of gait and mobility: Secondary | ICD-10-CM

## 2024-02-11 NOTE — Therapy (Signed)
 OUTPATIENT PHYSICAL THERAPY TREATMENT/PROGRESS NOTE  Dates of Reporting Period: 01/07/24 - 02/11/24  Patient Name: Pamela Garner MRN: [id number] DOB:01-02-1941, 83 y.o., female Today's Date: 02/11/2024  PCP: Fernande Ophelia JINNY DOUGLAS, MD  REFERRING PROVIDER: Fernande Ophelia JINNY DOUGLAS, MD  END OF SESSION:   PT End of Session - 02/11/24 1320     Visit Number 20    Number of Visits 25    Date for PT Re-Evaluation 02/05/24    Authorization Type [employer] Medicare    Authorization Time Period 02/04/24-03/03/24 for 6 visits    Authorization - Number of Visits 6    Progress Note Due on Visit 20    PT Start Time 1318    PT Stop Time 1400    PT Time Calculation (min) 42 min    Equipment Utilized During Treatment Gait belt    Activity Tolerance Patient tolerated treatment well;No increased pain    Behavior During Therapy WFL for tasks assessed/performed          Past Medical History:  Diagnosis Date   Degenerative joint disease    Dermatophytosis, nail    feet   Hyperlipidemia    Hypertension    Lichen sclerosus    Thyroid disease    Trigeminal neuralgia of left side of face    Past Surgical History:  Procedure Laterality Date   ATRIAL FIBRILLATION ABLATION N/A 09/20/2023   Procedure: ATRIAL FIBRILLATION ABLATION;  Surgeon: Kennyth Chew, MD;  Location: [facility] INVASIVE CV LAB;  Service: Cardiovascular;  Laterality: N/A;   COLONOSCOPY  2008   DILATION AND CURETTAGE OF UTERUS  1985   endometrial polyps   Patient Active Problem List   Diagnosis Date Noted   New onset atrial flutter (HCC) 06/13/2023   Diplopia 06/12/2023   CVA (cerebral vascular accident) (HCC) 06/12/2023   Atrial fibrillation (HCC) 06/12/2023   GERD (gastroesophageal reflux disease) 10/14/2019   RBBB (right bundle branch block) 10/14/2019   Lichen sclerosus et atrophicus of the vulva 06/13/2017   Thyroid disease    Hypertension    Hyperlipidemia    Degenerative joint disease    ONSET DATE: 06/10/23  REFERRING DIAG: I63.9  (ICD-10-CM) - CVA (cerebral vascular accident) (HCC)  THERAPY DIAG:  Difficulty in walking, not elsewhere classified  Imbalance  Muscle weakness (generalized)  Chronic pain of right knee  Rationale for Evaluation and Treatment: Rehabilitation  SUBJECTIVE:                                                                                                                                                                                            SUBJECTIVE STATEMENT: Pt reports having  R knee pain currently per her usual pain quality. Had f/u with orthopedic for her knee and got a gel injection. Reports it did not help at all .  PERTINENT HISTORY: 82yoF referred OPPT s/p CVA and falls. PMH: obesity, hypertension, hyperlipidemia, thyroid disease, CVA and new onset atrial fibrillation.   PAIN:  Are you having pain? No  PRECAUTIONS: Fall  WEIGHT BEARING RESTRICTIONS: No  FALLS: Has patient fallen in last 6 months? Yes. Number of falls 3  LIVING ENVIRONMENT: Lives with: lives with their spouse Lives in: House/apartment Stairs: Yes: External: 3 steps; on right going up Has following equipment at home: Single point cane and Walker - 2 wheeled  PLOF: Independent  PATIENT GOALS: Pt is most concerned with the Rt knee and working it too much.   OBJECTIVE: Note: Objective measures were completed at Evaluation unless otherwise noted.                                                                                                                            TREATMENT DATE: 02/11/24  Physical Performance:  Reviewed remaining LTG's as pt is due a progress note for 20th visit. Some goals reviewed on 02/04/24 for recent RECERT. See clinical impression and goals section for details.    PATIENT EDUCATION: Education details: how to navigate post PT soreness and symptoms reagrding HEP performance.  Person educated: Patient Education method: Explanation Education comprehension: verbalized  understanding  HOME EXERCISE PROGRAM: Access Code: [email or web address] URL: [email or web address] Date: 12/25/2023 Prepared by: Peggye Linear  Exercises - Seated Long Arc Quad  - 1 x daily - 7 x weekly - 2 sets - 10 reps - 1 sec hold - Seated March  - 1 x daily - 7 x weekly - 2 sets - 10 reps - Sit to Stand  - 1 x daily - 7 x weekly - 2 sets - 8 reps - Heel Raises with Counter Support  - 1 x daily - 7 x weekly - 2 sets - 15 reps  GOALS: Goals reviewed with patient? Yes  SHORT TERM GOALS: Target date: 12/11/2023  Pt will be independent with HEP in order to demonstrate increased ability to perform tasks related to occupation/hobbies. Baseline:  Issued 5/30, not performing anymore.  Goal status: NOT MET   LONG TERM GOALS: Target date: 03/03/2024  1.  Patient (> 45 years old) will complete five times sit to stand test in < 15 seconds indicating an increased LE strength and improved balance. Baseline: 24.89 sec; 7/17: 24.52 seconds with UE support on arm rests 8/11: 24 sec with B UE assist Goal status: UNCHANGED  2.  Patient will improve the LEFS by 11% in order to demonstrate functional independence without pain. Baseline: 20/80; 25%; 7/17: 18%; 02/11/24: 25/80 25% Goal status: UNCHANGED    3.  Patient will increase Berg Balance score by > 6 points to demonstrate decreased fall risk during functional activities. Baseline: 42; 7/17: 41; 02/11/24: 44/56 Goal status: IN  PROGRESS   3.  Patient will reduce timed up and go to <11 seconds to reduce fall risk and demonstrate improved transfer/gait ability. Baseline: 26.98 sec with no AD; 7/17: 19.53 seconds with no AD; 02/11/24: 26.67 sec no AD Goal status: IN PROGRESS  4.  Patient will increase 10 meter walk test to >1.54m/s as to improve gait speed for better community ambulation and to reduce fall risk. Baseline: 29.08 with no AD; 20.65 with cane; 8/11:16.2, 15.2 sec on first and second trial respectively using 4WW (0.54m/s)  Goal  status: IN PROGRESS  5.  Patient will increase six minute walk test distance to >1000 for progression to community ambulator and improve gait ability Baseline: 465 ft with SPC; 7/17: 483 ft with SPC; 8/11:749 ft with 4WW Goal status: IN PROGRESS  ASSESSMENT:  CLINICAL IMPRESSION:  Pt on 20th visit warranting progress note. Pt has regressed in LEFS and TUG however is progressing towards her Lars balance goal. Pt remains needing regular UE support for steadying with dynamic movements vastly limiting progression in her Berg scores. Will continue to progress POC, update HEP, and f/u on rollator order as pt is nearing the end of her approved POC. Pt will continue to benefit from skilled physical therapy intervention to address impairments, improve QOL, and attain therapy goals.   OBJECTIVE IMPAIRMENTS: Abnormal gait, decreased activity tolerance, decreased balance, decreased endurance, decreased mobility, difficulty walking, decreased strength, and pain.  ACTIVITY LIMITATIONS: carrying, lifting, bending, sitting, standing, squatting, stairs, transfers, and locomotion level PARTICIPATION LIMITATIONS: meal prep, cleaning, laundry, driving, shopping, community activity, and yard work PERSONAL FACTORS: Age, Fitness, Past/current experiences, Time since onset of injury/illness/exacerbation, and 3+ comorbidities: HTN, CVA, Afib, DJD, R knee pain are also affecting patient's functional outcome.  REHAB POTENTIAL: Good CLINICAL DECISION MAKING: Evolving/moderate complexity EVALUATION COMPLEXITY: Moderate  PLAN:  PT FREQUENCY: 2x/week  PT DURATION: 12 weeks  PLANNED INTERVENTIONS: 97750- Physical Performance Testing, 97110-Therapeutic exercises, 97530- Therapeutic activity, 97112- Neuromuscular re-education, 97535- Self Care, 02859- Manual therapy, [phone]- Gait training, Balance training, Stair training, Dry Needling, Joint mobilization, Joint manipulation, Spinal manipulation, Spinal mobilization,  Vestibular training, Cryotherapy, and Moist heat  PLAN FOR NEXT SESSION:  Pt has 4 remaining authorized visits; start preparing for DC, HEP, rollator order, etc.    Dorina HERO. Fairly IV, PT, DPT Physical Therapist- [place]  [facility] 3:02 PM, 02/11/24

## 2024-02-14 ENCOUNTER — Ambulatory Visit

## 2024-02-14 ENCOUNTER — Encounter: Admitting: Speech Pathology

## 2024-02-14 DIAGNOSIS — R262 Difficulty in walking, not elsewhere classified: Secondary | ICD-10-CM | POA: Diagnosis not present

## 2024-02-14 DIAGNOSIS — G8929 Other chronic pain: Secondary | ICD-10-CM

## 2024-02-14 DIAGNOSIS — M6281 Muscle weakness (generalized): Secondary | ICD-10-CM

## 2024-02-14 DIAGNOSIS — R2689 Other abnormalities of gait and mobility: Secondary | ICD-10-CM

## 2024-02-14 NOTE — Therapy (Signed)
 OUTPATIENT PHYSICAL THERAPY TREATMENT   Patient Name: Pamela Garner MRN: [id number] DOB:April 17, 1941, 83 y.o., female Today's Date: 02/14/2024  PCP: Fernande Ophelia JINNY DOUGLAS, MD  REFERRING PROVIDER: Fernande Ophelia JINNY DOUGLAS, MD  END OF SESSION:   PT End of Session - 02/14/24 1449     Visit Number 21    Number of Visits 25    Date for PT Re-Evaluation 02/05/24    Authorization Type [employer] Medicare    Authorization Time Period 02/04/24-03/03/24 for 6 visits    Authorization - Number of Visits 6    Progress Note Due on Visit 20    PT Start Time 1447    PT Stop Time 1530    PT Time Calculation (min) 43 min    Equipment Utilized During Treatment Gait belt    Activity Tolerance Patient tolerated treatment well;No increased pain    Behavior During Therapy WFL for tasks assessed/performed          Past Medical History:  Diagnosis Date   Degenerative joint disease    Dermatophytosis, nail    feet   Hyperlipidemia    Hypertension    Lichen sclerosus    Thyroid disease    Trigeminal neuralgia of left side of face    Past Surgical History:  Procedure Laterality Date   ATRIAL FIBRILLATION ABLATION N/A 09/20/2023   Procedure: ATRIAL FIBRILLATION ABLATION;  Surgeon: Kennyth Chew, MD;  Location: [facility] INVASIVE CV LAB;  Service: Cardiovascular;  Laterality: N/A;   COLONOSCOPY  2008   DILATION AND CURETTAGE OF UTERUS  1985   endometrial polyps   Patient Active Problem List   Diagnosis Date Noted   New onset atrial flutter (HCC) 06/13/2023   Diplopia 06/12/2023   CVA (cerebral vascular accident) (HCC) 06/12/2023   Atrial fibrillation (HCC) 06/12/2023   GERD (gastroesophageal reflux disease) 10/14/2019   RBBB (right bundle branch block) 10/14/2019   Lichen sclerosus et atrophicus of the vulva 06/13/2017   Thyroid disease    Hypertension    Hyperlipidemia    Degenerative joint disease    ONSET DATE: 06/10/23  REFERRING DIAG: I63.9 (ICD-10-CM) - CVA (cerebral vascular accident)  (HCC)  THERAPY DIAG:  Difficulty in walking, not elsewhere classified  Imbalance  Muscle weakness (generalized)  Chronic pain of right knee  Rationale for Evaluation and Treatment: Rehabilitation  SUBJECTIVE:                                                                                                                                                                                            SUBJECTIVE STATEMENT: Pt reports R knee pain is aggravating today. Has had some  catching.  PERTINENT HISTORY: 82yoF referred OPPT s/p CVA and falls. PMH: obesity, hypertension, hyperlipidemia, thyroid disease, CVA and new onset atrial fibrillation.   PAIN:  Are you having pain? No  PRECAUTIONS: Fall  WEIGHT BEARING RESTRICTIONS: No  FALLS: Has patient fallen in last 6 months? Yes. Number of falls 3  LIVING ENVIRONMENT: Lives with: lives with their spouse Lives in: House/apartment Stairs: Yes: External: 3 steps; on right going up Has following equipment at home: Single point cane and Walker - 2 wheeled  PLOF: Independent  PATIENT GOALS: Pt is most concerned with the Rt knee and working it too much.   OBJECTIVE: Note: Objective measures were completed at Evaluation unless otherwise noted.                                                                                                                            TREATMENT DATE: 02/14/24  Gait training:   Gait 1x150' using SPC CGA. 1 min, 27 sec. Wants to use SPC in RUE with R knee pain. Educated pt on using SPC in LUE. Does temporarily for maybe first 25' then reverts back to saying she has to use the [facility] on RUE. Correct sequencing with either hand.    Gait 150' using rollator supervision. 1 min, 9 sec. Significant improvement in step lengths and gait speed. Additional 150' completed un-timed.    Time spent reviewing brakes and how to use for STS and stand to sit to reduce falls risk.    There.Act:  STS with UE's needed on arm  rests, supervision: 2x8   Standing hip abduction BUE support with 2.5# AW's: 2x8/side. VC's for upright posture with fair carryover    Standing hip extension BUE support with 2.5# AW's: 2x10/side     Standing heel raises with BUE support with 2.5# AW's: 3x12 bilat. VC's for full range of motion with good carryover.    PATIENT EDUCATION: Education details: how to navigate post PT soreness and symptoms reagrding HEP performance.  Person educated: Patient Education method: Explanation Education comprehension: verbalized understanding  HOME EXERCISE PROGRAM: Access Code: [email or web address] URL: [email or web address] Date: 12/25/2023 Prepared by: Peggye Linear  Exercises - Seated Long Arc Quad  - 1 x daily - 7 x weekly - 2 sets - 10 reps - 1 sec hold - Seated March  - 1 x daily - 7 x weekly - 2 sets - 10 reps - Sit to Stand  - 1 x daily - 7 x weekly - 2 sets - 8 reps - Heel Raises with Counter Support  - 1 x daily - 7 x weekly - 2 sets - 15 reps  GOALS: Goals reviewed with patient? Yes  SHORT TERM GOALS: Target date: 12/11/2023  Pt will be independent with HEP in order to demonstrate increased ability to perform tasks related to occupation/hobbies. Baseline:  Issued 5/30, not performing anymore.  Goal status: NOT MET   LONG TERM GOALS: Target date: 03/03/2024  1.  Patient (>  64 years old) will complete five times sit to stand test in < 15 seconds indicating an increased LE strength and improved balance. Baseline: 24.89 sec; 7/17: 24.52 seconds with UE support on arm rests 8/11: 24 sec with B UE assist Goal status: UNCHANGED  2.  Patient will improve the LEFS by 11% in order to demonstrate functional independence without pain. Baseline: 20/80; 25%; 7/17: 18%; 02/11/24: 25/80 25% Goal status: UNCHANGED    3.  Patient will increase Berg Balance score by > 6 points to demonstrate decreased fall risk during functional activities. Baseline: 42; 7/17: 41; 02/11/24: 44/56 Goal  status: IN PROGRESS   3.  Patient will reduce timed up and go to <11 seconds to reduce fall risk and demonstrate improved transfer/gait ability. Baseline: 26.98 sec with no AD; 7/17: 19.53 seconds with no AD; 02/11/24: 26.67 sec no AD Goal status: IN PROGRESS  4.  Patient will increase 10 meter walk test to >1.25m/s as to improve gait speed for better community ambulation and to reduce fall risk. Baseline: 29.08 with no AD; 20.65 with cane; 8/11:16.2, 15.2 sec on first and second trial respectively using 4WW (0.71m/s)  Goal status: IN PROGRESS  5.  Patient will increase six minute walk test distance to >1000 for progression to community ambulator and improve gait ability Baseline: 465 ft with SPC; 7/17: 483 ft with SPC; 8/11:749 ft with 4WW Goal status: IN PROGRESS  ASSESSMENT:  CLINICAL IMPRESSION:  Continuing PT POC working on OKC hip and knee strengthening and gait with rollator. Pt continues to objectively display safer and improved gait quality with rollator compared to [facility]. Pt remains limited by chronic R knee pain limiting strength training progression especially in CKC. Pt does appear to have improved tolerance for OKC compared to CKC. Pt will continue to benefit from skilled physical therapy intervention to address impairments, improve QOL, and attain therapy goals.   OBJECTIVE IMPAIRMENTS: Abnormal gait, decreased activity tolerance, decreased balance, decreased endurance, decreased mobility, difficulty walking, decreased strength, and pain.  ACTIVITY LIMITATIONS: carrying, lifting, bending, sitting, standing, squatting, stairs, transfers, and locomotion level PARTICIPATION LIMITATIONS: meal prep, cleaning, laundry, driving, shopping, community activity, and yard work PERSONAL FACTORS: Age, Fitness, Past/current experiences, Time since onset of injury/illness/exacerbation, and 3+ comorbidities: HTN, CVA, Afib, DJD, R knee pain are also affecting patient's functional outcome.  REHAB  POTENTIAL: Good CLINICAL DECISION MAKING: Evolving/moderate complexity EVALUATION COMPLEXITY: Moderate  PLAN:  PT FREQUENCY: 2x/week  PT DURATION: 12 weeks  PLANNED INTERVENTIONS: 97750- Physical Performance Testing, 97110-Therapeutic exercises, 97530- Therapeutic activity, 97112- Neuromuscular re-education, 97535- Self Care, [id number]- Manual therapy, [phone]- Gait training, Balance training, Stair training, Dry Needling, Joint mobilization, Joint manipulation, Spinal manipulation, Spinal mobilization, Vestibular training, Cryotherapy, and Moist heat  PLAN FOR NEXT SESSION:  Pt has 4 remaining authorized visits; start preparing for DC, HEP, rollator order, etc.    Dorina HERO. Fairly IV, PT, DPT Physical Therapist- [place]  [facility] 3:52 PM, 02/14/24

## 2024-02-18 ENCOUNTER — Encounter: Admitting: Speech Pathology

## 2024-02-18 ENCOUNTER — Ambulatory Visit

## 2024-02-18 DIAGNOSIS — M6281 Muscle weakness (generalized): Secondary | ICD-10-CM

## 2024-02-18 DIAGNOSIS — R262 Difficulty in walking, not elsewhere classified: Secondary | ICD-10-CM

## 2024-02-18 DIAGNOSIS — R2689 Other abnormalities of gait and mobility: Secondary | ICD-10-CM

## 2024-02-18 DIAGNOSIS — G8929 Other chronic pain: Secondary | ICD-10-CM

## 2024-02-18 NOTE — Therapy (Signed)
 OUTPATIENT PHYSICAL THERAPY TREATMENT   Patient Name: Pamela Garner MRN: [id number] DOB:1941-01-19, 83 y.o., female Today's Date: 02/18/2024  PCP: Fernande Ophelia JINNY DOUGLAS, MD  REFERRING PROVIDER: Fernande Ophelia JINNY DOUGLAS, MD  END OF SESSION:   PT End of Session - 02/18/24 1324     Visit Number 22    Number of Visits 25    Date for PT Re-Evaluation 02/21/24   (last approved visit)   Authorization Type [employer] Medicare    Authorization Time Period 02/04/24-03/03/24 for 6 visits    Authorization - Visit Number 5    Authorization - Number of Visits 6    Progress Note Due on Visit 30    PT Start Time 1315    PT Stop Time 1355    PT Time Calculation (min) 40 min    Equipment Utilized During Treatment Gait belt    Activity Tolerance Patient tolerated treatment well;No increased pain    Behavior During Therapy WFL for tasks assessed/performed          Past Medical History:  Diagnosis Date   Degenerative joint disease    Dermatophytosis, nail    feet   Hyperlipidemia    Hypertension    Lichen sclerosus    Thyroid disease    Trigeminal neuralgia of left side of face    Past Surgical History:  Procedure Laterality Date   ATRIAL FIBRILLATION ABLATION N/A 09/20/2023   Procedure: ATRIAL FIBRILLATION ABLATION;  Surgeon: Kennyth Chew, MD;  Location: [facility] INVASIVE CV LAB;  Service: Cardiovascular;  Laterality: N/A;   COLONOSCOPY  2008   DILATION AND CURETTAGE OF UTERUS  1985   endometrial polyps   Patient Active Problem List   Diagnosis Date Noted   New onset atrial flutter (HCC) 06/13/2023   Diplopia 06/12/2023   CVA (cerebral vascular accident) (HCC) 06/12/2023   Atrial fibrillation (HCC) 06/12/2023   GERD (gastroesophageal reflux disease) 10/14/2019   RBBB (right bundle branch block) 10/14/2019   Lichen sclerosus et atrophicus of the vulva 06/13/2017   Thyroid disease    Hypertension    Hyperlipidemia    Degenerative joint disease    ONSET DATE: 06/10/23  REFERRING DIAG: I63.9  (ICD-10-CM) - CVA (cerebral vascular accident) (HCC)  THERAPY DIAG:  Difficulty in walking, not elsewhere classified  Imbalance  Muscle weakness (generalized)  Chronic pain of right knee  Rationale for Evaluation and Treatment: Rehabilitation  SUBJECTIVE:                                                                                                                                                                                            SUBJECTIVE  STATEMENT: Pt had a busy morning, went to see foot doctor due to a Rt hallux injury and nail trim. Pt went to a new breakfast restaurant.    PERTINENT HISTORY: 82yoF referred OPPT s/p CVA and falls. PMH: obesity, hypertension, hyperlipidemia, thyroid disease, CVA and new onset atrial fibrillation.   PAIN:  Are you having pain? No  PRECAUTIONS: Fall  WEIGHT BEARING RESTRICTIONS: No  FALLS: Has patient fallen in last 6 months? Yes. Number of falls 3  LIVING ENVIRONMENT: Lives with: lives with their spouse Lives in: House/apartment Stairs: Yes: External: 3 steps; on right going up Has following equipment at home: Single point cane and Walker - 2 wheeled  PLOF: Independent  PATIENT GOALS: Pt is most concerned with the Rt knee and working it too much.   OBJECTIVE: Note: Objective measures were completed at Evaluation unless otherwise noted.                                                                                                                           TREATMENT DATE: 02/18/24  -AA/ROM, aerobic endurance training (pain precludes walking this amount of time): Nustep x8 minutes, level 3, seat 7, arms 7.5 *pt needs help with getting onto device and adjusting machine/seat/handles  -standing heel raises x15 (BUE supported)  -standing bilat ankle DF x15 (BUE supported)  -STS from chair + airex hands free x10 (still has less that optimal foot placement which makes it difficult)  -standing heel raises x15 (BUE supported)   -standing bilat ankle DF x15 (BUE supported)  -STS from chair + airex hands free x10 (still has less that optimal foot placement which makes it difficult)  -AMB overground c 4WW 388ft (supervision level) 2:38 -AMB overground c 4WW 335ft (supervision level) 2:51  PATIENT EDUCATION: Education details: None today  Person educated: Patient Education method: Explanation Education comprehension: verbalized understanding  HOME EXERCISE PROGRAM: Access Code: [email or web address] URL: [email or web address] Date: 12/25/2023 Prepared by: Peggye Linear  Exercises - Seated Long Arc Quad  - 1 x daily - 7 x weekly - 2 sets - 10 reps - 1 sec hold - Seated March  - 1 x daily - 7 x weekly - 2 sets - 10 reps - Sit to Stand  - 1 x daily - 7 x weekly - 2 sets - 8 reps - Heel Raises with Counter Support  - 1 x daily - 7 x weekly - 2 sets - 15 reps  GOALS: Goals reviewed with patient? Yes  SHORT TERM GOALS: Target date: 12/11/2023  Pt will be independent with HEP in order to demonstrate increased ability to perform tasks related to occupation/hobbies. Baseline:  Issued 5/30, not performing anymore.  Goal status: NOT MET   LONG TERM GOALS: Target date: 03/03/2024  1.  Patient (> 73 years old) will complete five times sit to stand test in < 15 seconds indicating an increased LE strength and improved balance. Baseline: 24.89 sec; 7/17: 24.52 seconds with UE support on  arm rests 8/11: 24 sec with B UE assist Goal status: UNCHANGED  2.  Patient will improve the LEFS by 11% in order to demonstrate functional independence without pain. Baseline: 20/80; 25%; 7/17: 18%; 02/11/24: 25/80 31% Goal status: UNCHANGED    3.  Patient will increase Berg Balance score by > 6 points to demonstrate decreased fall risk during functional activities. Baseline: 42; 7/17: 41; 02/11/24: 44/56 Goal status: IN PROGRESS   3.  Patient will reduce timed up and go to <11 seconds to reduce fall risk and demonstrate improved  transfer/gait ability. Baseline: 26.98 sec with no AD; 7/17: 19.53 seconds with no AD; 02/11/24: 26.67 sec no AD Goal status: IN PROGRESS  4.  Patient will increase 10 meter walk test to >1.57m/s as to improve gait speed for better community ambulation and to reduce fall risk. Baseline: 29.08 with no AD; 20.65 with cane; 8/11:16.2, 15.2 sec on first and second trial respectively using 4WW (0.51m/s)  Goal status: IN PROGRESS  5.  Patient will increase six minute walk test distance to >1000 for progression to community ambulator and improve gait ability Baseline: 465 ft with SPC; 7/17: 483 ft with SPC; 8/11:749 ft with 4WW Goal status: IN PROGRESS  ASSESSMENT:  CLINICAL IMPRESSION:  No significant changes to plan today. Pt arrives a little fatigued from a busy morning of errand. Similar tolerance to interventions as previous sessions. Pt has 1 remaining visit approved by insurance, then will plan on DC. No pain in knee during session today. Pt will continue to benefit from skilled physical therapy intervention to address impairments, improve QOL, and attain therapy goals.   OBJECTIVE IMPAIRMENTS: Abnormal gait, decreased activity tolerance, decreased balance, decreased endurance, decreased mobility, difficulty walking, decreased strength, and pain.  ACTIVITY LIMITATIONS: carrying, lifting, bending, sitting, standing, squatting, stairs, transfers, and locomotion level PARTICIPATION LIMITATIONS: meal prep, cleaning, laundry, driving, shopping, community activity, and yard work PERSONAL FACTORS: Age, Fitness, Past/current experiences, Time since onset of injury/illness/exacerbation, and 3+ comorbidities: HTN, CVA, Afib, DJD, R knee pain are also affecting patient's functional outcome.  REHAB POTENTIAL: Good CLINICAL DECISION MAKING: Evolving/moderate complexity EVALUATION COMPLEXITY: Moderate  PLAN:  PT FREQUENCY: 2x/week  PT DURATION: 12 weeks  PLANNED INTERVENTIONS: 97750- Physical  Performance Testing, 97110-Therapeutic exercises, 97530- Therapeutic activity, 97112- Neuromuscular re-education, 97535- Self Care, 02859- Manual therapy, [phone]- Gait training, Balance training, Stair training, Dry Needling, Joint mobilization, Joint manipulation, Spinal manipulation, Spinal mobilization, Vestibular training, Cryotherapy, and Moist heat  PLAN FOR NEXT SESSION:  Next visit is 6 of 6, will plan on DC after this visit.    1:26 PM, 02/18/24 Peggye JAYSON Linear, PT, DPT Physical Therapist - [facility]  Out[facility] Physical [facility] [phone]

## 2024-02-21 ENCOUNTER — Encounter: Admitting: Speech Pathology

## 2024-02-21 ENCOUNTER — Ambulatory Visit: Admitting: Physical Therapy

## 2024-02-21 DIAGNOSIS — G8929 Other chronic pain: Secondary | ICD-10-CM

## 2024-02-21 DIAGNOSIS — R262 Difficulty in walking, not elsewhere classified: Secondary | ICD-10-CM

## 2024-02-21 DIAGNOSIS — M6281 Muscle weakness (generalized): Secondary | ICD-10-CM

## 2024-02-21 DIAGNOSIS — R2689 Other abnormalities of gait and mobility: Secondary | ICD-10-CM

## 2024-02-21 NOTE — Therapy (Signed)
 OUTPATIENT PHYSICAL THERAPY TREATMENT/ DISCHARGE    Patient Name: Pamela Garner MRN: [id number] DOB:1941/03/30, 83 y.o., female Today's Date: 02/21/2024  PCP: Fernande Ophelia JINNY DOUGLAS, MD  REFERRING PROVIDER: Fernande Ophelia JINNY DOUGLAS, MD  END OF SESSION:   PT End of Session - 02/21/24 1529     Visit Number 23    Number of Visits 25    Date for PT Re-Evaluation 02/21/24   (last approved visit)   Authorization Type [employer]    Authorization Time Period 02/04/24-03/03/24 for 6 visits    Authorization - Number of Visits 6    Progress Note Due on Visit 30    PT Start Time 1445    PT Stop Time 1525    PT Time Calculation (min) 40 min    Equipment Utilized During Treatment Gait belt    Activity Tolerance Patient tolerated treatment well;No increased pain    Behavior During Therapy WFL for tasks assessed/performed           Past Medical History:  Diagnosis Date   Degenerative joint disease    Dermatophytosis, nail    feet   Hyperlipidemia    Hypertension    Lichen sclerosus    Thyroid disease    Trigeminal neuralgia of left side of face    Past Surgical History:  Procedure Laterality Date   ATRIAL FIBRILLATION ABLATION N/A 09/20/2023   Procedure: ATRIAL FIBRILLATION ABLATION;  Surgeon: Kennyth Chew, MD;  Location: [facility] INVASIVE CV LAB;  Service: Cardiovascular;  Laterality: N/A;   COLONOSCOPY  2008   DILATION AND CURETTAGE OF UTERUS  1985   endometrial polyps   Patient Active Problem List   Diagnosis Date Noted   New onset atrial flutter (HCC) 06/13/2023   Diplopia 06/12/2023   CVA (cerebral vascular accident) (HCC) 06/12/2023   Atrial fibrillation (HCC) 06/12/2023   GERD (gastroesophageal reflux disease) 10/14/2019   RBBB (right bundle branch block) 10/14/2019   Lichen sclerosus et atrophicus of the vulva 06/13/2017   Thyroid disease    Hypertension    Hyperlipidemia    Degenerative joint disease    ONSET DATE: 06/10/23  REFERRING DIAG: I63.9 (ICD-10-CM) - CVA  (cerebral vascular accident) (HCC)  THERAPY DIAG:  No diagnosis found.  Rationale for Evaluation and Treatment: Rehabilitation  SUBJECTIVE:                                                                                                                                                                                            SUBJECTIVE STATEMENT: Pt had a busy morning, went to see foot doctor due to a Rt hallux injury and nail trim.  Pt went to a new breakfast restaurant.    PERTINENT HISTORY: 82yoF referred OPPT s/p CVA and falls. PMH: obesity, hypertension, hyperlipidemia, thyroid disease, CVA and new onset atrial fibrillation.   PAIN:  Are you having pain? No  PRECAUTIONS: Fall  WEIGHT BEARING RESTRICTIONS: No  FALLS: Has patient fallen in last 6 months? Yes. Number of falls 3  LIVING ENVIRONMENT: Lives with: lives with their spouse Lives in: House/apartment Stairs: Yes: External: 3 steps; on right going up Has following equipment at home: Single point cane and Walker - 2 wheeled  PLOF: Independent  PATIENT GOALS: Pt is most concerned with the Rt knee and working it too much.   OBJECTIVE: Note: Objective measures were completed at Evaluation unless otherwise noted.                                                                                                                           TREATMENT DATE: 02/21/24  Review of HEP for discharge planning  2 x 10 LAQ  2 x 10 seated march  2 x 8  STS  Gait with 4 WW x 150 ft  2 x 15 heel raises with counter support  Gait with 4WW x 320 ft   Self care:   Instruction in proper way to adjust 4WW for optimal posture and benefit. Also instructed in process for obtaining and provided with several local stores she could get them from. Answered questions regarding proper completion of Hep and new handout provided ( no changes made to HEP)    PATIENT EDUCATION: Education details: None today  Person educated: Patient Education  method: Explanation Education comprehension: verbalized understanding  HOME EXERCISE PROGRAM: Access Code: [phone] URL: [email or web address] Date: 12/25/2023 Prepared by: Peggye Linear  Exercises - Seated Long Arc Quad  - 1 x daily - 7 x weekly - 2 sets - 10 reps - 1 sec hold - Seated March  - 1 x daily - 7 x weekly - 2 sets - 10 reps - Sit to Stand  - 1 x daily - 7 x weekly - 2 sets - 8 reps - Heel Raises with Counter Support  - 1 x daily - 7 x weekly - 2 sets - 15 reps  GOALS: Goals reviewed with patient? Yes  SHORT TERM GOALS: Target date: 12/11/2023  Pt will be independent with HEP in order to demonstrate increased ability to perform tasks related to occupation/hobbies. Baseline:  Issued 5/30, not performing anymore.  Goal status: NOT MET   LONG TERM GOALS: Target date: 03/03/2024  1.  Patient (> 28 years old) will complete five times sit to stand test in < 15 seconds indicating an increased LE strength and improved balance. Baseline: 24.89 sec; 7/17: 24.52 seconds with UE support on arm rests 8/11: 24 sec with B UE assist Goal status: UNCHANGED  2.  Patient will improve the LEFS by 11% in order to demonstrate functional independence without pain. Baseline: 20/80; 25%; 7/17: 18%; 02/11/24:  25/80 31% Goal status: UNCHANGED    3.  Patient will increase Berg Balance score by > 6 points to demonstrate decreased fall risk during functional activities. Baseline: 42; 7/17: 41; 02/11/24: 44/56 Goal status: IN PROGRESS   3.  Patient will reduce timed up and go to <11 seconds to reduce fall risk and demonstrate improved transfer/gait ability. Baseline: 26.98 sec with no AD; 7/17: 19.53 seconds with no AD; 02/11/24: 26.67 sec no AD Goal status: IN PROGRESS  4.  Patient will increase 10 meter walk test to >1.70m/s as to improve gait speed for better community ambulation and to reduce fall risk. Baseline: 29.08 with no AD; 20.65 with cane; 8/11:16.2, 15.2 sec on first and  second trial respectively using 4WW (0.77m/s)  Goal status: IN PROGRESS  5.  Patient will increase six minute walk test distance to >1000 for progression to community ambulator and improve gait ability Baseline: 465 ft with SPC; 7/17: 483 ft with SPC; 8/11:749 ft with 4WW Goal status: IN PROGRESS  ASSESSMENT:  CLINICAL IMPRESSION:   Pt presents for discharge therapy this date. Pt still has not been regularly completing HEP. We are still waiting on signed order for rollator to improve her mobility. Plan to discharge today. Reviewed HEP and instructed in importance of strength in her Les and she verbalized understanding. Pt instructed in how to use MD order for 4WW which came back from MD today.   OBJECTIVE IMPAIRMENTS: Abnormal gait, decreased activity tolerance, decreased balance, decreased endurance, decreased mobility, difficulty walking, decreased strength, and pain.  ACTIVITY LIMITATIONS: carrying, lifting, bending, sitting, standing, squatting, stairs, transfers, and locomotion level PARTICIPATION LIMITATIONS: meal prep, cleaning, laundry, driving, shopping, community activity, and yard work PERSONAL FACTORS: Age, Fitness, Past/current experiences, Time since onset of injury/illness/exacerbation, and 3+ comorbidities: HTN, CVA, Afib, DJD, R knee pain are also affecting patient's functional outcome.  REHAB POTENTIAL: Good CLINICAL DECISION MAKING: Evolving/moderate complexity EVALUATION COMPLEXITY: Moderate  PLAN:  PT FREQUENCY: 2x/week  PT DURATION: 12 weeks  PLANNED INTERVENTIONS: 97750- Physical Performance Testing, 97110-Therapeutic exercises, 97530- Therapeutic activity, 97112- Neuromuscular re-education, 97535- Self Care, [id number]- Manual therapy, [phone]- Gait training, Balance training, Stair training, Dry Needling, Joint mobilization, Joint manipulation, Spinal manipulation, Spinal mobilization, Vestibular training, Cryotherapy, and Moist heat  PLAN FOR NEXT SESSION:   D/C  today .    3:30 PM, 02/21/24 Note: Portions of this document were prepared using Dragon voice recognition software and although reviewed may contain unintentional dictation errors in syntax, grammar, or spelling.  Lonni KATHEE Gainer PT ,DPT Physical Therapist- [place]  [facility]

## 2024-02-28 ENCOUNTER — Ambulatory Visit: Payer: Medicare Other | Admitting: Dermatology

## 2024-02-28 ENCOUNTER — Ambulatory Visit

## 2024-02-28 ENCOUNTER — Encounter: Admitting: Speech Pathology

## 2024-03-03 ENCOUNTER — Encounter: Admitting: Speech Pathology

## 2024-03-03 ENCOUNTER — Ambulatory Visit

## 2024-03-06 ENCOUNTER — Ambulatory Visit

## 2024-03-06 ENCOUNTER — Encounter: Admitting: Speech Pathology

## 2024-03-10 ENCOUNTER — Ambulatory Visit

## 2024-03-10 ENCOUNTER — Encounter: Admitting: Speech Pathology

## 2024-03-13 ENCOUNTER — Ambulatory Visit

## 2024-03-13 ENCOUNTER — Encounter: Admitting: Speech Pathology

## 2024-03-17 ENCOUNTER — Ambulatory Visit

## 2024-03-17 ENCOUNTER — Encounter: Admitting: Speech Pathology

## 2024-03-20 ENCOUNTER — Encounter: Admitting: Speech Pathology

## 2024-03-20 ENCOUNTER — Ambulatory Visit

## 2024-03-24 ENCOUNTER — Encounter: Admitting: Speech Pathology

## 2024-03-24 ENCOUNTER — Ambulatory Visit: Admitting: Dermatology

## 2024-03-24 ENCOUNTER — Ambulatory Visit

## 2024-03-24 DIAGNOSIS — L820 Inflamed seborrheic keratosis: Secondary | ICD-10-CM | POA: Diagnosis not present

## 2024-03-24 DIAGNOSIS — Z1283 Encounter for screening for malignant neoplasm of skin: Secondary | ICD-10-CM

## 2024-03-24 DIAGNOSIS — L814 Other melanin hyperpigmentation: Secondary | ICD-10-CM | POA: Diagnosis not present

## 2024-03-24 DIAGNOSIS — W908XXA Exposure to other nonionizing radiation, initial encounter: Secondary | ICD-10-CM | POA: Diagnosis not present

## 2024-03-24 DIAGNOSIS — L578 Other skin changes due to chronic exposure to nonionizing radiation: Secondary | ICD-10-CM

## 2024-03-24 DIAGNOSIS — L719 Rosacea, unspecified: Secondary | ICD-10-CM

## 2024-03-24 DIAGNOSIS — L821 Other seborrheic keratosis: Secondary | ICD-10-CM | POA: Diagnosis not present

## 2024-03-24 DIAGNOSIS — D1801 Hemangioma of skin and subcutaneous tissue: Secondary | ICD-10-CM

## 2024-03-24 DIAGNOSIS — D692 Other nonthrombocytopenic purpura: Secondary | ICD-10-CM

## 2024-03-24 DIAGNOSIS — Z7189 Other specified counseling: Secondary | ICD-10-CM

## 2024-03-24 DIAGNOSIS — D229 Melanocytic nevi, unspecified: Secondary | ICD-10-CM

## 2024-03-24 DIAGNOSIS — Z79899 Other long term (current) drug therapy: Secondary | ICD-10-CM

## 2024-03-24 MED ORDER — METRONIDAZOLE 1 % EX GEL: CUTANEOUS | 11 refills | Status: AC

## 2024-03-24 NOTE — Patient Instructions (Signed)

## 2024-03-24 NOTE — Progress Notes (Unsigned)
 Follow-Up Visit   Subjective  Pamela Garner is a 83 y.o. female who presents for the following: Skin Cancer Screening and Full Body Skin Exam  The patient presents for Total-Body Skin Exam (TBSE) for skin cancer screening and mole check. The patient has spots, moles and lesions to be evaluated, some may be new or changing and the patient may have concern these could be cancer.   The following portions of the chart were reviewed this encounter and updated as appropriate: medications, allergies, medical history  Review of Systems:  No other skin or systemic complaints except as noted in HPI or Assessment and Plan.  Objective  Well appearing patient in no apparent distress; mood and affect are within normal limits.  A full examination was performed including scalp, head, eyes, ears, nose, lips, neck, chest, axillae, abdomen, back, buttocks, bilateral upper extremities, bilateral lower extremities, hands, feet, fingers, toes, fingernails, and toenails. All findings within normal limits unless otherwise noted below.   Relevant physical exam findings are noted in the Assessment and Plan.  inf neck/chest x 2, nasal root x 1, forehead and temple x 12 (15) Erythematous stuck-on, waxy papule or plaque  Assessment & Plan   SKIN CANCER SCREENING PERFORMED TODAY.  ACTINIC DAMAGE - Chronic condition, secondary to cumulative UV/sun exposure - diffuse scaly erythematous macules with underlying dyspigmentation - Recommend daily broad spectrum sunscreen SPF 30+ to sun-exposed areas, reapply every 2 hours as needed.  - Staying in the shade or wearing long sleeves, sun glasses (UVA+UVB protection) and wide brim hats (4-inch brim around the entire circumference of the hat) are also recommended for sun protection.  - Call for new or changing lesions.  LENTIGINES, SEBORRHEIC KERATOSES, HEMANGIOMAS - Benign normal skin lesions - Benign-appearing - Call for any changes  MELANOCYTIC NEVI - Tan-brown  and/or pink-flesh-colored symmetric macules and papules - Benign appearing on exam today - Observation - Call clinic for new or changing moles - Recommend daily use of broad spectrum spf 30+ sunscreen to sun-exposed areas.   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 Condition is symptomatic/ bothersome to patient. Not currently at goal. Rosacea is a chronic progressive skin condition usually affecting the face of adults, causing redness and/or acne bumps. It is treatable but not curable. It sometimes affects the eyes (ocular rosacea) as well. It may respond to topical and/or systemic medication and can flare with stress, sun exposure, alcohol, exercise, topical steroids (including hydrocortisone/cortisone 10) and some foods.  Daily application of broad spectrum spf 30+ sunscreen to face is recommended to reduce flares. Treatment Plan Start Metrogel 1% apply to the face QD.  INFLAMED SEBORRHEIC KERATOSIS (15) inf neck/chest x 2, nasal root x 1, forehead and temple x 12 (15) Symptomatic, irritating, patient would like treated. Destruction of lesion - inf neck/chest x 2, nasal root x 1, forehead and temple x 12 (15) Complexity: simple   Destruction method: cryotherapy   Informed consent: discussed and consent obtained   Timeout:  patient name, date of birth, surgical site, and procedure verified Lesion destroyed using liquid nitrogen: Yes   Region frozen until ice ball extended beyond lesion: Yes   Outcome: patient tolerated procedure well with no complications   Post-procedure details: wound care instructions given     Purpura - Chronic; persistent and recurrent.  Treatable, but not curable. - Violaceous macules and patches - Benign - Related to trauma, age,  sun damage and/or use of blood thinners, chronic use of topical and/or oral steroids - Observe - Can  use OTC arnica containing moisturizer such as Dermend Bruise Formula if desired - Call for worsening or other concerns  Return in about 1 year (around 03/24/2025) for TBSE - hx AK, ISK.  I, Rosina Mayans, CMA, am acting as scribe for Alm Rhyme, MD .   Documentation: I have reviewed the above documentation for accuracy and completeness, and I agree with the above.  Alm Rhyme, MD

## 2024-03-26 ENCOUNTER — Encounter: Payer: Self-pay | Admitting: Dermatology

## 2024-03-27 ENCOUNTER — Encounter: Admitting: Speech Pathology

## 2024-03-27 ENCOUNTER — Ambulatory Visit

## 2024-03-31 ENCOUNTER — Ambulatory Visit

## 2024-03-31 ENCOUNTER — Encounter: Admitting: Speech Pathology

## 2024-04-01 ENCOUNTER — Ambulatory Visit

## 2024-04-03 ENCOUNTER — Ambulatory Visit

## 2024-04-03 ENCOUNTER — Encounter: Admitting: Speech Pathology

## 2024-04-07 ENCOUNTER — Ambulatory Visit

## 2024-04-07 ENCOUNTER — Encounter: Admitting: Speech Pathology

## 2024-04-10 ENCOUNTER — Encounter: Admitting: Speech Pathology

## 2024-04-10 ENCOUNTER — Ambulatory Visit

## 2024-04-14 ENCOUNTER — Ambulatory Visit

## 2024-04-14 ENCOUNTER — Encounter: Admitting: Speech Pathology

## 2024-04-17 ENCOUNTER — Encounter: Admitting: Speech Pathology

## 2024-07-23 NOTE — Progress Notes (Signed)
 " History of Present Illness Pamela Garner is an 84 year old female who presents for an annual exam and Medicare wellness  She has been experiencing acid reflux for several months, particularly when sitting for extended periods. She frequently uses Tums for relief but is concerned about long-term use. She has not tried other medications like Prilosec or omeprazole.  She has a history of sleep apnea with a prior sleep study showing an AHI of 18, indicating moderate disease. She has been resistant to using CPAP therapy due to discomfort with the mask and is exploring alternative options.  She is on chronic anticoagulation for paroxysmal atrial fibrillation and has not experienced any recent symptoms. She monitors her blood pressure at home.  Her dyslipidemia has improved with high-dose statin therapy, and she continues to make lifestyle efforts to manage her condition. Her liver, lipids, and thyroid levels are regularly monitored.  She has prediabetes, which has shown improvement. She continues efforts to maintain her weight and follows treatment guidelines, with regular monitoring of glucose and A1c levels.  She experiences right knee pain due to osteoarthritis and uses a cane for balance. She received a cortisone injection in October, which provided relief after a couple of months. She follows up with orthopedics as needed.  She has a history of an abdominal wall hernia, with no evidence of progression. She continues efforts to maintain her weight to manage this condition.  She has hearing aids but reports difficulty noticing a difference in crowded environments. She sometimes forgets to wear them due to inconvenience with her routine.  Medicare Wellness Visit   Providers [facility] Dr. Ophelia [facility] cardiology [facility] neurology [facility] Dr. Dale[facility]  Functional Assessment (1) Hearing: Mild hearing  changes; has hearing aids which she does not like (2) Risk of Falls: Reports no falls in the last year (3) Home Safety: Patient feels secure in their home. There are operational smoke alarms in multiple areas of the home. (4) Activities of Daily Living: Independently manages personal grooming and household chores, including cooking, cleaning and laundry.  Manages Personal finances without assistance.    Depression Screening PHQ 2/9 last 3 flowsheet values     07/23/2023    8:16 AM 04/22/2024   10:45 AM 07/24/2024    9:45 AM  PHQ-2/9 Depression Screening   Little interest or pleasure in doing things 0 0 0  Feeling down, depressed, or hopeless 0 0 0  Patient Health Questionnaire-2 Score 0 * 0 0    * Data saved with a previous flowsheet row definition     Depression Severity and Treatment Recommendations:  0-4= None  5-9= Mild / Treatment: Support, educate to call if worse; return in one month  10-14= Moderate / Treatment: Support, watchful waiting; Antidepressant or Psychotherapy  15-19= Moderately severe / Treatment: Antidepressant OR Psychotherapy  >= 20 = Major depression, severe / Antidepressant AND Psychotherapy   Cognitive impairment Mild memory changes.  Responses appear appropriate and timely to this observer.   Prevention Plan  Item name                              Frequency        Month Due       Year Due Health Maintenance  Topic Date Due   Medicare Subsequent AWV [phone]  07/23/2024   Annual Physical/Well Child Check  07/23/2024   Shingrix (1  of 2) 01/21/2025 (Originally 12/23/2007)   Adult Tetanus (Td And Tdap)  07/24/2025 (Originally 04/08/2024)   RSV Immunization Pregnant or 50+ (1 - 1-dose 75+ series) 07/24/2025 (Originally 05/12/2016)   Creatinine Level  07/17/2025   Potassium Level  07/17/2025   TSH Level  07/17/2025   Lipid Panel  07/17/2025   Serum Calcium   07/17/2025   Depression Screening  07/24/2025   Diabetes Screening  07/18/2027    DXA Bone Density Scan  05/13/2029   Medicare Initial AWV [id number]  Completed   Pneumococcal Vaccine: 50+  Completed   Hib Vaccines  Aged Out   Hepatitis A Vaccines  Aged Out   Meningococcal B Vaccine  Aged Out   Meningococcal ACWY Vaccine  Aged Out   HPV Vaccines  Aged Out   COVID-19 Vaccine  Discontinued   Mammogram  Discontinued   Colorectal Cancer Screening  Discontinued   Influenza Vaccine  Discontinued    Other personalized health advice Encouraged patient to exercise regularly.  Encouraged attention to diet with good intake of fruits, vegetables, and limitation of red meat to 2 times a week or less    End of Life Counseling Patient has a living will in place.  Patient is DNR   Alcohol Screening: Alcohol Use: Not At Risk (07/24/2024)   AUDIT-C    Frequency of Alcohol Consumption: Never    Average Number of Drinks: Patient does not drink    Frequency of Binge Drinking: Never      Current Outpatient Medications  Medication Sig Dispense Refill   amLODIPine (NORVASC) 5 MG tablet Take 1 tablet (5 mg total) by mouth at bedtime 90 tablet 1   apixaban  (ELIQUIS ) 5 mg tablet Take 1 tablet (5 mg total) by mouth 2 (two) times daily 180 tablet 3   FUROsemide (LASIX) 20 MG tablet Take 1 tablet (20 mg total) by mouth once daily as needed for Edema 30 tablet 11   levothyroxine  (SYNTHROID ) 112 MCG tablet TAKE 1 TABLET ORALLY DAILY ON EMPTY STOMACH WITH GLASS OF WATER AT LEAST 30-60 MIN BEFORE BREAKFAST 90 tablet 3   lisinopriL (ZESTRIL) 40 MG tablet Take 1 tablet (40 mg total) by mouth once daily 90 tablet 1   metoprolol  SUCCinate (TOPROL -XL) 25 MG XL tablet Take 0.5 tablets (12.5 mg total) by mouth 3 (three) times daily as needed 135 tablet 1   OXcarbazepine (TRILEPTAL) 150 MG tablet TAKE 1 TABLET (150 MG TOTAL) BY MOUTH EVERY 12 (TWELVE) HOURS 180 tablet 3   potassium chloride (KLOR-CON) 10 MEQ ER tablet Take 1 tablet (10 mEq total) by mouth once daily as needed 30  tablet 11   traZODone (DESYREL) 50 MG tablet TAKE 1 TABLET BY MOUTH EVERY DAY AT BEDTIME AS NEEDED FOR SLEEP 90 tablet 1   metroNIDAZOLE  (METROGEL ) 1 % gel Apply topically once daily     pantoprazole  (PROTONIX ) 40 MG DR tablet Take 1 tablet (40 mg total) by mouth once daily 30 tablet 11   No current facility-administered medications for this visit.    Allergies as of 07/24/2024 - Reviewed 07/24/2024  Allergen Reaction Noted   Actifed cold-allergy [chlorpheniramine-phenylephrine ] Rash 03/24/2014    Past Medical History:  Diagnosis Date   DJD (degenerative joint disease)    GERD (gastroesophageal reflux disease)    improved with PPIs   Hyperlipidemia    Hypertension    Hypothyroidism    with hx of goiter   Postmenopausal    RBBB (right bundle branch block)    noted  on preop ECG 09/2011    Past Surgical History:  Procedure Laterality Date   COLONOSCOPY  11/16/2006   Dr. EMERSON Mariner @ [facility] - Diverticulosis, rpt 10 yrs per MUS   CHOLECYSTECTOMY  10/2011   Dr. Lorrene      Family History  Problem Relation Name Age of Onset   Asthma Other     Cancer Other     Ulcers Other      Social History   Socioeconomic History   Marital status: Married  Tobacco Use   Smoking status: Never   Smokeless tobacco: Never  Vaping Use   Vaping status: Never Used  Substance and Sexual Activity   Alcohol use: Not Currently   Drug use: Never   Sexual activity: Defer   Social Drivers of Health   Financial Resource Strain: Low Risk  (07/24/2024)   Overall Financial Resource Strain (CARDIA)    Difficulty of Paying Living Expenses: Not hard at all  Food Insecurity: No Food Insecurity (07/24/2024)   Hunger Vital Sign    Worried About Running Out of Food in the Last Year: Never true    Ran Out of Food in the Last Year: Never true  Transportation Needs: No Transportation Needs (07/24/2024)   PRAPARE - Administrator, Civil Service (Medical): No    Lack  of Transportation (Non-Medical): No    Goals Addressed   None      Results for orders placed or performed in visit on 07/17/24  CBC w/auto Differential (5 Part)  Result Value Ref Range   WBC (White Blood Cell Count) 8.5 4.1 - 10.2 103/uL   RBC (Red Blood Cell Count) 4.68 4.04 - 5.48 106/uL   Hemoglobin 14.6 12.0 - 15.0 gm/dL   Hematocrit 54.6 64.9 - 47.0 %   MCV (Mean Corpuscular Volume) 96.8 80.0 - 100.0 fl   MCH (Mean Corpuscular Hemoglobin) 31.2 27.0 - 31.2 pg   MCHC (Mean Corpuscular Hemoglobin Concentration) 32.2 32.0 - 36.0 gm/dL   Platelet Count 774 849 - 450 103/uL   RDW-CV (Red Cell Distribution Width) 13.1 11.6 - 14.8 %   MPV (Mean Platelet Volume) 11.5 9.4 - 12.4 fl   Neutrophils 5.25 1.50 - 7.80 103/uL   Lymphocytes 1.87 1.00 - 3.60 103/uL   Monocytes 1.10 0.00 - 1.50 103/uL   Eosinophils 0.19 0.00 - 0.55 103/uL   Basophils 0.10 (H) 0.00 - 0.09 103/uL   Neutrophil % 61.6 32.0 - 70.0 %   Lymphocyte % 21.9 10.0 - 50.0 %   Monocyte % 12.9 4.0 - 13.0 %   Eosinophil % 2.2 1.0 - 5.0 %   Basophil% 1.2 0.0 - 2.0 %   Immature Granulocyte % 0.2 <=0.7 %   Immature Granulocyte Count 0.02 <=0.06 10^3/L  Comprehensive Metabolic Panel (CMP)  Result Value Ref Range   Glucose 103 70 - 110 mg/dL   Sodium 860 863 - 854 mmol/L   Potassium 4.9 3.6 - 5.1 mmol/L   Chloride 102 97 - 109 mmol/L   Carbon Dioxide (CO2) 32.6 (H) 22.0 - 32.0 mmol/L   Urea Nitrogen (BUN) 22 7 - 25 mg/dL   Creatinine 0.7 0.6 - 1.1 mg/dL   Glomerular Filtration Rate (eGFR) 86 >60 mL/min/1.73sq m   Calcium  9.9 8.7 - 10.3 mg/dL   AST  18 8 - 39 U/L   ALT  20 5 - 38 U/L   Alk Phos (alkaline Phosphatase) 74 34 - 104 U/L   Albumin 4.1 3.5 - 4.8  g/dL   Bilirubin, Total 0.7 0.3 - 1.2 mg/dL   Protein, Total 7.4 6.1 - 7.9 g/dL   A/G Ratio 1.2 1.0 - 5.0 gm/dL  Hemoglobin J8R  Result Value Ref Range   Hemoglobin A1C 6.1 (H) 4.2 - 5.6 %   Average Blood Glucose (Calc) 128 mg/dL   Narrative   Normal  Range:    4.2 - 5.6% Increased Risk:  5.7 - 6.4% Diabetes:        >= 6.5% Glycemic Control for adults with diabetes:  <7%    Lipid Panel w/calc LDL  Result Value Ref Range   Cholesterol, Total 188 100 - 200 mg/dL   Triglyceride 863 35 - 199 mg/dL   HDL (High Density Lipoprotein) Cholesterol 52.4 35.0 - 85.0 mg/dL   LDL Calculated 891 0 - 130 mg/dL   VLDL Cholesterol 27 mg/dL   Cholesterol/HDL Ratio 3.6   Microalbumin/Creatinine Ratio, Random Urine  Result Value Ref Range   Creatinine, Random Urine 164.0 37.0 - 250.0 mg/dL   Urine Albumin, Random 11   mg/L   Urine Albumin/Creatinine Ratio 6.7 <30.0 ug/mg  Urinalysis w/Microscopic  Result Value Ref Range   Color Yellow Colorless, Straw, Light Yellow, Yellow, Dark Yellow   Clarity Clear Clear   Specific Gravity 1.020 1.005 - 1.030   pH, Urine 5.5 5.0 - 8.0   Protein, Urinalysis Negative Negative mg/dL   Glucose, Urinalysis Negative Negative mg/dL   Ketones, Urinalysis Negative Negative mg/dL   Blood, Urinalysis Negative Negative   Nitrite, Urinalysis Negative Negative   Leukocyte Esterase, Urinalysis Negative Negative   Bilirubin, Urinalysis Negative Negative   Urobilinogen, Urinalysis 0.2 0.2 - 1.0 mg/dL   WBC, UA 1 <=5 /hpf   Red Blood Cells, Urinalysis 1 <=3 /hpf   Bacteria, Urinalysis 0-5 0 - 5 /hpf   Squamous Epithelial Cells, Urinalysis 0 /hpf  Thyroid Stimulating-Hormone (TSH)  Result Value Ref Range   Thyroid Stimulating Hormone (TSH) 4.064 0.450-5.330 uIU/ml uIU/mL      BP 118/76   Pulse 62   Ht 160 cm (5' 3)   Wt 84.6 kg (186 lb 6.4 oz)   SpO2 98%   BMI 33.02 kg/m   GENERAL:  Well developed, well nourished patient, in no distress. HEENT:  Pupils equal and round. Extraocular motion intact.  Oropharynx is moist without lesions.  TMs clear NECK: Increased muscle tension.  No thyromegaly; trachea midline.   LUNGS:  Clear bilaterally without retractions or wheezing.  CARDIAC:  Regular rate and rhythm, without  murmurs, rubs or gallops.  VASCULAR:  Carotid and radial pulses 2+.  Distal pulses 2+ BREAST: Normal to palpation without masses or tenderness ABDOMEN:  Soft, nontender, nondistended.  No guard or rebound.  There is a protuberance in the midline just above the umbilicus on the right side that is fairly sizable it is consistent with an abdominal wall hernia GENITALIA/RECTAL: Patient declines EXTREMITIES:  No clubbing, cyanosis, or significant edema.  Arthritis changes. NEUROLOGIC:  Cranial nerves appear grossly intact.  Mildly antalgic gait; motor and sensory exams otherwise intact and symmetrical  PE Chaperone note: A chaperone was included for sensitive portions of the exam- staff member name: Julianna Pounds   Primary hypertension  (primary encounter diagnosis) Paroxysmal atrial fibrillation (CMS/HHS-HCC) Prediabetes OSA (obstructive sleep apnea) Hypothyroidism due to acquired atrophy of thyroid Other hyperlipidemia Acquired thrombophilia (HHS-HCC) History of CVA (cerebrovascular accident) Gastroesophageal reflux disease without esophagitis Routine general medical examination at a health care facility Depression screening  Neurology notes reviewed;  orthopedic notes reviewed  Assessment & Plan Primary hypertension Hypertension is well-controlled under current regimen. - Continue current antihypertensive regimen. - Monitor blood pressure at home every couple of weeks.  Limit sodium.  Follow metabolic panel, urinalysis, microalbumin  Paroxysmal atrial fibrillation No recent symptoms. Protected by anticoagulation and metoprolol . - Continue anticoagulation and metoprolol . - Educated on pulse checking and use of smartwatch for rhythm monitoring.  Obstructive sleep apnea Moderate disease with resistance to CPAP. Discussed alternative CPAP masks and potential weight loss interventions. - Encouraged weight loss. - Discussed alternative CPAP masks with sleep specialist. - Will consider  weight loss medications if CPAP is not tolerated.  Chronic knee pain due to osteoarthritis Right knee pain managed with cortisone injections. Surgery not recommended due to cardiac concerns. - Continue follow-up with orthopedics as needed. - Will consider repeat cortisone injection if pain worsens.  Weight loss efforts  Other hyperlipidemia Dyslipidemia improved with high-dose statin therapy. - Continue high-dose statin therapy. - Encouraged lifestyle modifications.  Follow liver, lipids, thyroid  Prediabetes Improved with weight management efforts. - Continue weight management efforts. - Monitor glucose and A1c.  Treatment guidelines are reviewed  Hypothyroidism due to acquired atrophy of thyroid Thyroid function is well-managed with current levothyroxine  dose. - Continue current levothyroxine  dose. Follow TSH  Gastroesophageal reflux disease Chronic GERD managed with Tums. Discussed pantoprazole  to reduce reliance on Tums and prevent complications with anticoagulation therapy. - Prescribed pantoprazole  once daily. - Instructed to monitor symptoms and adjust treatment as needed.  Watch diet.  Contact us  for worsening  Abdominal wall hernia No evidence of progression, per the patient.  At this point she is comfortable watching it.  Signs and symptoms of worsening have been reviewed - Continue weight management efforts.  General Health Maintenance Vaccines reviewed. Declined shingles vaccine. Discussed potential benefits of new shingles vaccine with higher efficacy. - Continue routine health maintenance.  Follow-up in 6 months, returning sooner if needed   BERT JACK KLEIN III, MD  Portions of this note were created using dictation software and may contain typographical errors.  This note has been created using automated tools and reviewed for accuracy by BERT JACK KLEIN III. Use of artificial intelligence tool (ABRIDGE) to help with documentation was discussed with  patient. *Some images could not be shown."

## 2024-07-25 NOTE — Progress Notes (Unsigned)
 "     Electrophysiology Clinic Note    Date:  07/28/2024  Patient ID:  Pamela, Garner 1940/09/27, MRN [id number] PCP:  Fernande Ophelia JINNY DOUGLAS, MD  Cardiologist:  Lonni Hanson, MD  Electrophysiologist:  Fonda Kitty, MD  Electrophysiology APP:  Rhythm Wigfall, NP     Discussed the use of AI scribe software for clinical note transcription with the patient, who gave verbal consent to proceed.   Patient Profile    Chief Complaint: AFib follow-up  History of Present Illness: Pamela Garner is a 84 y.o. female with PMH notable for parox afib, typical aflutter, CVA, HTN, ILD, OSA ; seen today for Fonda Kitty, MD for routine electrophysiology followup.   She was diagnosed with AFib after presenting to hospital with CVA in 04/2023. She was asymptomatic of afib.  She is s/p AF ablation with isolation of pulm veins, posterior wall, and CTI for aflutter on 09/20/2023 by Dr. Kitty.   I last saw her 11/2023 where she was maintaining sinus during appointment. Historically, asymptomatic of AFib. Updated zio monitor confirmed no AFib.   Today, she is not aware of any AF episodes. He checks BP regularly, did not realize there is an abnormal rhythm indicator.  She denies chest pain, chest pressure, palpitations, SOB, dizziness or LH. She continues to take eliquis  BID, no bleeding concerns. She recently misplaced a bottle (30d) of eliquis  and is concerned her insurance may not authorize an early refill.      Arrhythmia/Device History No specialty comments available.    ROS:  Please see the history of present illness. All other systems are reviewed and otherwise negative.    Physical Exam    VS:  BP 122/76 (BP Location: Left Arm, Patient Position: Sitting, Cuff Size: Large)   Pulse 62   Ht 5' 3 (1.6 m)   Wt 184 lb (83.5 kg)   SpO2 97%   BMI 32.59 kg/m  BMI: Body mass index is 32.59 kg/m.           Wt Readings from Last 3 Encounters:  07/28/24 184 lb (83.5 kg)  12/17/23 185 lb  9.6 oz (84.2 kg)  09/20/23 183 lb (83 kg)      GEN- The patient is well appearing, alert and oriented x 3 today.   Lungs- Clear to ausculation bilaterally, normal work of breathing.  Heart- Regular rate and rhythm, no murmurs, rubs or gallops Extremities- No peripheral edema, warm, dry   Studies Reviewed   Previous EP, cardiology notes.    EKG is ordered. Personal review of EKG from today shows:    EKG Interpretation Date/Time:  Monday July 28 2024 11:25:02 EST Ventricular Rate:  62 PR Interval:  328 QRS Duration:  126 QT Interval:  410 QTC Calculation: 416 R Axis:   117  Text Interpretation: Sinus rhythm with 1st degree A-V block Right bundle branch block Confirmed by Caliyah Sieh [phone]) on 07/28/2024 11:29:57 AM    CT chest over-read 08/27/2023 Chronic interstitial lung disease.    Cardiac CT, 08/27/2023 1. There is normal pulmonary vein drainage into the left atrium. (2 on the right and 2 on the left) with ostial measurements as above.  2. The left atrial appendage is a Windsock-cactus type with ostial size 31 x 26 mm and length 54 mm, Area 60 mm2. There is no thrombus in the left atrial appendage.  3. The esophagus runs in the left atrial midline and is not in the proximity to any of the pulmonary veins.  4. Coronary calcium  score of 0.   Long term monitor, 07/12/2023   The patient was monitored for 13 days, 23 hours.  10 days, 2 hours were suitable for analysis after removal of artifact.   The predominant rhythm was sinus with an average rate of 64 bpm (range 45-99 bpm in sinus).   There were rare PACs and PVCs.   Paroxysmal atrial fibrillation was observed, with an average ventricular rate of 67 bpm (range 42-101 bpm in atrial fibrillation).  Longest episode lasted 4 hours, 25 minutes.  Atrial fibrillation burden was 5%.   No prolonged pause was seen.   Single patient triggered event corresponded to normal sinus rhythm.   TTE, 06/13/2023  1. Left ventricular  ejection fraction, by estimation, is 60 to 65%. The left ventricle has normal function. The left ventricle has no regional wall motion abnormalities. There is moderate left ventricular hypertrophy. Left ventricular diastolic parameters are indeterminate.   2. Right ventricular systolic function is normal. The right ventricular size is normal.   3. The mitral valve is normal in structure. Mild mitral valve regurgitation. No evidence of mitral stenosis.   4. The aortic valve is normal in structure. Aortic valve regurgitation is not visualized. No aortic stenosis is present.   5. The inferior vena cava is normal in size with greater than 50% respiratory variability, suggesting right atrial pressure of 3 mmHg.   6. Rhythm is atrial flutter    Assessment and Plan     #) parox AFib #) typical aflutter S/p AF, aflutter ablation 2025 by Dr. Kennyth Maintaining sinus rhythm Encouraged her to research irregular pulse notification on BP machine   #) Hypercoag d/t parox afib CHA2DS2-VASc Score = at least 6 [CHF History: 0, HTN History: 1, Diabetes History: 0, Stroke History: 2, Vascular Disease History: 0, Age Score: 2, Gender Score: 1].  Therefore, the patient's annual risk of stroke is 9.7 %.    Stroke ppx - 5mg  eliquis  BID, appropriately dosed No bleeding concerns Recent lab work via [employer] w stable CBC, Cr < 1.5         Current medicines are reviewed at length with the patient today.   The patient does not have concerns regarding her medicines.  The following changes were made today:  none   Labs/ tests ordered today include:  Orders Placed This Encounter  Procedures   EKG 12-Lead     Disposition: Follow up with Dr. Kennyth or EP APP in 12 months   Signed, Jaquilla Woodroof, NP  07/28/24  2:46 PM  Electrophysiology [facility] "

## 2024-07-28 ENCOUNTER — Ambulatory Visit: Admitting: Cardiology

## 2024-07-28 ENCOUNTER — Encounter: Payer: Self-pay | Admitting: Cardiology

## 2024-07-28 VITALS — BP 122/76 | HR 62 | Ht 63.0 in | Wt 184.0 lb

## 2024-07-28 DIAGNOSIS — I480 Paroxysmal atrial fibrillation: Secondary | ICD-10-CM | POA: Diagnosis not present

## 2024-07-28 DIAGNOSIS — I483 Typical atrial flutter: Secondary | ICD-10-CM | POA: Diagnosis not present

## 2024-07-28 DIAGNOSIS — D6869 Other thrombophilia: Secondary | ICD-10-CM

## 2024-07-28 MED ORDER — APIXABAN 5 MG PO TABS: 5.0000 mg | ORAL_TABLET | Freq: Two times a day (BID) | ORAL | 11 refills | Status: AC

## 2024-07-28 MED ORDER — APIXABAN 5 MG PO TABS: 5.0000 mg | ORAL_TABLET | Freq: Two times a day (BID) | ORAL | 0 refills | Status: AC

## 2025-03-25 ENCOUNTER — Ambulatory Visit: Admitting: Dermatology
